# Patient Record
Sex: Male | Born: 1965 | Race: White | Hispanic: No | Marital: Married | Smoking: Current every day smoker
Health system: Southern US, Community
[De-identification: ages and names within clinical notes are randomized; demographics above are authoritative.]

## PROBLEM LIST (undated history)

## (undated) DIAGNOSIS — E7800 Pure hypercholesterolemia, unspecified: Secondary | ICD-10-CM

## (undated) HISTORY — PX: EXPLORATORY LAPAROTOMY: SUR591

---

## 2013-10-24 ENCOUNTER — Emergency Department (HOSPITAL_COMMUNITY)
Admission: EM | Admit: 2013-10-24 | Discharge: 2013-10-24 | Disposition: A | Discharge status: Home / Self Care | Payer: BC Managed Care – PPO | Attending: Emergency Medicine | Admitting: Emergency Medicine

## 2013-10-24 ENCOUNTER — Encounter (HOSPITAL_COMMUNITY): Payer: Self-pay | Admitting: Emergency Medicine

## 2013-10-24 ENCOUNTER — Emergency Department (HOSPITAL_COMMUNITY): Payer: BC Managed Care – PPO

## 2013-10-24 DIAGNOSIS — E7800 Pure hypercholesterolemia, unspecified: Secondary | ICD-10-CM | POA: Insufficient documentation

## 2013-10-24 DIAGNOSIS — B9789 Other viral agents as the cause of diseases classified elsewhere: Secondary | ICD-10-CM | POA: Insufficient documentation

## 2013-10-24 DIAGNOSIS — R000 Tachycardia, unspecified: Secondary | ICD-10-CM | POA: Insufficient documentation

## 2013-10-24 DIAGNOSIS — R5383 Other fatigue: Secondary | ICD-10-CM

## 2013-10-24 DIAGNOSIS — R5381 Other malaise: Secondary | ICD-10-CM | POA: Insufficient documentation

## 2013-10-24 DIAGNOSIS — R509 Fever, unspecified: Secondary | ICD-10-CM

## 2013-10-24 DIAGNOSIS — Z79899 Other long term (current) drug therapy: Secondary | ICD-10-CM | POA: Insufficient documentation

## 2013-10-24 DIAGNOSIS — IMO0001 Reserved for inherently not codable concepts without codable children: Secondary | ICD-10-CM | POA: Insufficient documentation

## 2013-10-24 DIAGNOSIS — B349 Viral infection, unspecified: Secondary | ICD-10-CM

## 2013-10-24 HISTORY — DX: Pure hypercholesterolemia, unspecified: E78.00

## 2013-10-24 LAB — BASIC METABOLIC PANEL
BUN: 7 mg/dL (ref 6–23)
CO2: 21 mEq/L (ref 19–32)
Calcium: 9.6 mg/dL (ref 8.4–10.5)
Chloride: 97 mEq/L (ref 96–112)
Creatinine, Ser: 0.74 mg/dL (ref 0.50–1.35)
GFR calc Af Amer: >90 mL/min (ref >90)
GFR calc non Af Amer: >90 mL/min (ref >90)
Glucose, Bld: 115 mg/dL — ABNORMAL HIGH (ref 70–99)
Potassium: 4.1 mEq/L (ref 3.7–5.3)
Sodium: 136 mEq/L — ABNORMAL LOW (ref 137–147)

## 2013-10-24 LAB — CBC
HCT: 42.5 % (ref 39.0–52.0)
Hemoglobin: 15.5 g/dL (ref 13.0–17.0)
MCH: 33.3 pg (ref 26.0–34.0)
MCHC: 36.5 g/dL — ABNORMAL HIGH (ref 30.0–36.0)
MCV: 91.2 fL (ref 78.0–100.0)
Platelets: 207 10*3/uL (ref 150–400)
RBC: 4.66 MIL/uL (ref 4.22–5.81)
RDW: 12.1 % (ref 11.5–15.5)
WBC: 7.5 10*3/uL (ref 4.0–10.5)

## 2013-10-24 LAB — I-STAT TROPONIN, ED: Troponin i, poc: 0 ng/mL (ref 0.00–0.08)

## 2013-10-24 MED ORDER — MORPHINE SULFATE 4 MG/ML IJ SOLN
4.0000 mg | Freq: Once | INTRAMUSCULAR | Status: AC
  Administered 2013-10-24: 4 mg via INTRAVENOUS
  Filled 2013-10-24: qty 1

## 2013-10-24 MED ORDER — ASPIRIN 325 MG PO TABS
325.0000 mg | ORAL_TABLET | ORAL | Status: AC
  Administered 2013-10-24: 325 mg via ORAL
  Filled 2013-10-24: qty 1

## 2013-10-24 MED ORDER — SODIUM CHLORIDE 0.9 % IV BOLUS (SEPSIS)
1000.0000 mL | Freq: Once | INTRAVENOUS | Status: AC
  Administered 2013-10-24: 1000 mL via INTRAVENOUS

## 2013-10-24 MED ORDER — ACETAMINOPHEN 325 MG PO TABS
650.0000 mg | ORAL_TABLET | Freq: Once | ORAL | Status: AC
  Administered 2013-10-24: 650 mg via ORAL
  Filled 2013-10-24: qty 2

## 2013-10-24 NOTE — Discharge Instructions (Signed)

## 2013-10-24 NOTE — ED Provider Notes (Signed)
CSN: [id number]     Arrival date & time 10/24/13  [id number] History   First MD Initiated Contact with Patient 10/24/13 [phone]     Chief Complaint  Patient presents with  . Chest Pain  . flu-like s/s      (Consider location/radiation/quality/duration/timing/severity/associated sxs/prior Treatment) Patient is a 48 y.o. male presenting with chest pain. The history is provided by the patient.  Chest Pain Pain location:  Substernal area Associated symptoms: fatigue   Associated symptoms: no abdominal pain, no back pain, no dysphagia, no headache, no nausea, no numbness, no shortness of breath, not vomiting and no weakness    patient states that he feels miserable all over. He has fevers and chills. He states he has some chest pain that goes from his mid chest over to his right arm. No cough. Mild sore throat. No nasal drainage. No cough. No shortness of breath, however he states it hurts to take a breath. No nausea vomiting or diarrhea. No dysuria. No sick contacts. No cardiac history.  Past Medical History  Diagnosis Date  . Hypercholesteremia    History reviewed. No pertinent past surgical history. No family history on file. History  Substance Use Topics  . Smoking status: Never Smoker   . Smokeless tobacco: Not on file  . Alcohol Use: 12.6 oz/week    21 Shots of liquor per week    Review of Systems  Constitutional: Positive for appetite change and fatigue. Negative for activity change.  HENT: Negative for rhinorrhea and trouble swallowing.   Eyes: Negative for pain.  Respiratory: Negative for chest tightness and shortness of breath.   Cardiovascular: Positive for chest pain. Negative for leg swelling.  Gastrointestinal: Negative for nausea, vomiting, abdominal pain and diarrhea.  Genitourinary: Negative for flank pain.  Musculoskeletal: Positive for myalgias. Negative for back pain, joint swelling and neck stiffness.  Skin: Negative for rash.  Neurological: Negative for weakness,  numbness and headaches.  Psychiatric/Behavioral: Negative for behavioral problems.      Allergies  Review of patient's allergies indicates no known allergies.  Home Medications   Current Outpatient Rx  Name  Route  Sig  Dispense  Refill  . atorvastatin (LIPITOR) 40 MG tablet   Oral   Take 40 mg by mouth daily.         . Pseudoephedrine-Ibuprofen (ADVIL COLD/SINUS PO)   Oral   Take 1 capsule by mouth 2 (two) times daily as needed.         . zolpidem (AMBIEN) 10 MG tablet   Oral   Take 10 mg by mouth at bedtime.          BP 114/67  Pulse 96  Temp(Src) 99.4 F (37.4 C) (Oral)  Resp 18  SpO2 98% Physical Exam  Nursing note and vitals reviewed. Constitutional: He is oriented to person, place, and time. He appears well-developed and well-nourished.  HENT:  Head: Normocephalic and atraumatic.  Eyes: Conjunctivae are normal.  Neck: Normal range of motion. Neck supple.  No meningismus  Cardiovascular: Regular rhythm and normal heart sounds.   No murmur heard. Tachycardia  Pulmonary/Chest: Effort normal and breath sounds normal.  Mild tachypnea  Abdominal: Soft. Bowel sounds are normal. He exhibits no distension and no mass. There is no tenderness. There is no rebound and no guarding.  Musculoskeletal: Normal range of motion. He exhibits no edema.  Neurological: He is alert and oriented to person, place, and time. No cranial nerve deficit.  Skin: Skin is warm and dry.  Psychiatric: He has a normal mood and affect.    ED Course  Procedures (including critical care time) Labs Review Labs Reviewed  CBC - Abnormal; Notable for the following:    MCHC 36.5 (*)    All other components within normal limits  BASIC METABOLIC PANEL - Abnormal; Notable for the following:    Sodium 136 (*)    Glucose, Bld 115 (*)    All other components within normal limits  I-STAT TROPOININ, ED   Imaging Review Dg Chest 2 View  10/24/2013   CLINICAL DATA:  Fever  EXAM: CHEST  2 VIEW   COMPARISON:  None.  FINDINGS: Low lung volumes. The heart size and mediastinal contours are within normal limits. Both lungs are clear. The visualized skeletal structures are unremarkable.  IMPRESSION: No active cardiopulmonary disease.   Electronically Signed   By: Salome Holmes M.D.   On: 10/24/2013 09:25     EKG Interpretation   Date/Time:  Thursday October 24 2013 08:27:04 EDT Ventricular Rate:  133 PR Interval:  134 QRS Duration: 86 QT Interval:  276 QTC Calculation: 410 R Axis:   79 Text Interpretation:  Sinus tachycardia Otherwise normal ECG Confirmed by  Rubin Payor  MD, Harrold Donath [phone]) on 10/24/2013 8:47:36 AM      MDM   Final diagnoses:  Fever  Viral infection    Patient with fever and chest pain. EKG and x-ray reassuring. Tachycardia has improved S. temperature is coming down. Maybe viral, or influenza. Patient feels much better after IV fluids and heart rate is normalized. Will discharge home. Doubt cardiac cause of the pain.    Juliet Rude. Rubin Payor, MD 10/24/13 1244

## 2013-10-24 NOTE — ED Notes (Signed)
PT states he was awoken with sub-sternal chest pain at 3 am.  Also c/o chills, cough and headache. Temp of 101.3 in triage.

## 2013-10-24 NOTE — ED Notes (Signed)
Pt comfortable with discharge and follow up instructions. No prescriptions given. 

## 2015-05-14 ENCOUNTER — Other Ambulatory Visit (HOSPITAL_COMMUNITY): Payer: Self-pay | Admitting: Orthopedic Surgery

## 2015-05-14 ENCOUNTER — Ambulatory Visit (HOSPITAL_COMMUNITY)
Admission: RE | Admit: 2015-05-14 | Discharge: 2015-05-14 | Disposition: A | Discharge status: Home / Self Care | Payer: BLUE CROSS/BLUE SHIELD | Source: Ambulatory Visit | Attending: Internal Medicine | Admitting: Internal Medicine

## 2015-05-14 DIAGNOSIS — M79605 Pain in left leg: Secondary | ICD-10-CM | POA: Insufficient documentation

## 2015-05-14 DIAGNOSIS — E785 Hyperlipidemia, unspecified: Secondary | ICD-10-CM | POA: Insufficient documentation

## 2016-03-14 DIAGNOSIS — Z1211 Encounter for screening for malignant neoplasm of colon: Secondary | ICD-10-CM | POA: Diagnosis not present

## 2016-03-14 DIAGNOSIS — K5730 Diverticulosis of large intestine without perforation or abscess without bleeding: Secondary | ICD-10-CM | POA: Diagnosis not present

## 2016-03-14 DIAGNOSIS — K640 First degree hemorrhoids: Secondary | ICD-10-CM | POA: Diagnosis not present

## 2016-10-12 DIAGNOSIS — R7309 Other abnormal glucose: Secondary | ICD-10-CM | POA: Diagnosis not present

## 2016-10-12 DIAGNOSIS — E7800 Pure hypercholesterolemia, unspecified: Secondary | ICD-10-CM | POA: Diagnosis not present

## 2016-10-12 DIAGNOSIS — Z0000 Encounter for general adult medical examination without abnormal findings: Secondary | ICD-10-CM | POA: Diagnosis not present

## 2016-10-17 DIAGNOSIS — Z0000 Encounter for general adult medical examination without abnormal findings: Secondary | ICD-10-CM | POA: Diagnosis not present

## 2016-10-17 DIAGNOSIS — R51 Headache: Secondary | ICD-10-CM | POA: Diagnosis not present

## 2016-10-17 DIAGNOSIS — G4700 Insomnia, unspecified: Secondary | ICD-10-CM | POA: Diagnosis not present

## 2016-10-17 DIAGNOSIS — E7800 Pure hypercholesterolemia, unspecified: Secondary | ICD-10-CM | POA: Diagnosis not present

## 2016-10-17 DIAGNOSIS — R7309 Other abnormal glucose: Secondary | ICD-10-CM | POA: Diagnosis not present

## 2016-10-19 ENCOUNTER — Other Ambulatory Visit: Payer: Self-pay | Admitting: Family Medicine

## 2016-10-19 DIAGNOSIS — R519 Headache, unspecified: Secondary | ICD-10-CM

## 2016-10-19 DIAGNOSIS — R51 Headache: Principal | ICD-10-CM

## 2016-10-24 ENCOUNTER — Ambulatory Visit
Admission: RE | Admit: 2016-10-24 | Discharge: 2016-10-24 | Disposition: A | Discharge status: Home / Self Care | Payer: BLUE CROSS/BLUE SHIELD | Source: Ambulatory Visit | Attending: Family Medicine | Admitting: Family Medicine

## 2016-10-24 DIAGNOSIS — R51 Headache: Secondary | ICD-10-CM | POA: Diagnosis not present

## 2016-10-24 DIAGNOSIS — R519 Headache, unspecified: Secondary | ICD-10-CM

## 2016-12-22 ENCOUNTER — Emergency Department (HOSPITAL_COMMUNITY): Payer: Worker's Compensation

## 2016-12-22 ENCOUNTER — Emergency Department (HOSPITAL_COMMUNITY)
Admission: EM | Admit: 2016-12-22 | Discharge: 2016-12-22 | Disposition: A | Discharge status: Home / Self Care | Payer: Worker's Compensation | Attending: Emergency Medicine | Admitting: Emergency Medicine

## 2016-12-22 ENCOUNTER — Encounter (HOSPITAL_COMMUNITY): Payer: Self-pay | Admitting: Emergency Medicine

## 2016-12-22 DIAGNOSIS — S6992XA Unspecified injury of left wrist, hand and finger(s), initial encounter: Secondary | ICD-10-CM

## 2016-12-22 DIAGNOSIS — Y939 Activity, unspecified: Secondary | ICD-10-CM | POA: Diagnosis not present

## 2016-12-22 DIAGNOSIS — Y929 Unspecified place or not applicable: Secondary | ICD-10-CM | POA: Insufficient documentation

## 2016-12-22 DIAGNOSIS — F17200 Nicotine dependence, unspecified, uncomplicated: Secondary | ICD-10-CM | POA: Insufficient documentation

## 2016-12-22 DIAGNOSIS — Z79899 Other long term (current) drug therapy: Secondary | ICD-10-CM | POA: Insufficient documentation

## 2016-12-22 DIAGNOSIS — S61012A Laceration without foreign body of left thumb without damage to nail, initial encounter: Secondary | ICD-10-CM | POA: Diagnosis present

## 2016-12-22 DIAGNOSIS — W312XXA Contact with powered woodworking and forming machines, initial encounter: Secondary | ICD-10-CM | POA: Diagnosis not present

## 2016-12-22 DIAGNOSIS — Y999 Unspecified external cause status: Secondary | ICD-10-CM | POA: Diagnosis not present

## 2016-12-22 MED ORDER — LIDOCAINE HCL (PF) 1 % IJ SOLN
30.0000 mL | Freq: Once | INTRAMUSCULAR | Status: AC
  Administered 2016-12-22: 30 mL
  Filled 2016-12-22: qty 30

## 2016-12-22 MED ORDER — BUPIVACAINE HCL 0.25 % IJ SOLN
20.0000 mL | Freq: Once | INTRAMUSCULAR | Status: AC
  Administered 2016-12-22: 20 mL
  Filled 2016-12-22: qty 20

## 2016-12-22 MED ORDER — SULFAMETHOXAZOLE-TRIMETHOPRIM 800-160 MG PO TABS
1.0000 | ORAL_TABLET | Freq: Two times a day (BID) | ORAL | 0 refills | Status: AC
Start: 2016-12-22 — End: 2016-12-29

## 2016-12-22 MED ORDER — HYDROCODONE-ACETAMINOPHEN 5-325 MG PO TABS: 1.0000 | ORAL_TABLET | Freq: Four times a day (QID) | ORAL | 0 refills | Status: DC | PRN

## 2016-12-22 NOTE — ED Triage Notes (Signed)
Pt states he cut his left thumb on a table saw   Pt went to urgent care and was sent here for further treatment  Bleeding controlled  Pt is able to move and bend his thumb

## 2016-12-22 NOTE — ED Provider Notes (Signed)
[facility]-EMERGENCY DEPT Provider Note   CSN: [id number] Arrival date & time: 12/22/16  1956 By signing my name below, I, Logan Martinez, attest that this documentation has been prepared under the direction and in the presence of non-physician practitioner, Shanna Cisco, PA-C. Electronically Signed: Levon Martinez, Scribe. 12/22/2016. 8:56 PM.   History   Chief Complaint Chief Complaint  Patient presents with  . Finger Injury   HPI Logan Martinez is a 51 y.o. male who presents to the Emergency Department complaining of a laceration to his left thumb sustained today at about 12:30 pm. Per pt, he accidentally cut his thumb while using a table saw. He reports associated moderate, throbbing pain to his left thumb. Pt initially went to Urgent Care, but was sent to the ED for further evaluation due to the severity of the laceration. He wrapped his thumb in gauze PTA, but did not attempt to soak or clean the area. Pt denies any numbness or weakness. Pt has no other acute complaints or associated symptoms at this time.  Tetanus UTD.  The history is provided by the patient. No language interpreter was used.   Past Medical History:  Diagnosis Date  . Hypercholesteremia     There are no active problems to display for this patient.   Past Surgical History:  Procedure Laterality Date  . EXPLORATORY LAPAROTOMY         Home Medications    Prior to Admission medications   Medication Sig Start Date End Date Taking? Authorizing Provider  atorvastatin (LIPITOR) 40 MG tablet Take 40 mg by mouth daily.    [provider]  HYDROcodone-acetaminophen (NORCO/VICODIN) 5-325 MG tablet Take 1 tablet by mouth every 6 (six) hours as needed for severe pain. 12/22/16   Syble Picco, Chase Picket, PA-C  Pseudoephedrine-Ibuprofen (ADVIL COLD/SINUS PO) Take 1 capsule by mouth 2 (two) times daily as needed.    [provider]  sulfamethoxazole-trimethoprim (BACTRIM DS,SEPTRA DS) 800-160 MG tablet Take 1  tablet by mouth 2 (two) times daily. 12/22/16 12/29/16  Warda Mcqueary, Chase Picket, PA-C  zolpidem (AMBIEN) 10 MG tablet Take 10 mg by mouth at bedtime.    [provider]    Family History Family History  Problem Relation Age of Onset  . Diabetes Other     Social History Social History  Substance Use Topics  . Smoking status: Current Every Day Smoker  . Smokeless tobacco: Never Used  . Alcohol use Yes     Comment: social     Allergies   Patient has no known allergies.   Review of Systems Review of Systems  Musculoskeletal: Positive for myalgias.  Skin: Positive for wound.  Neurological: Negative for weakness and numbness.   Physical Exam Updated Vital Signs BP (!) 159/97 (BP Location: Right Arm)   Pulse 84   Temp 99 F (37.2 C) (Oral)   Resp 18   Wt 195 lb 6.4 oz (88.6 kg)   SpO2 99%   Physical Exam  Constitutional: He appears well-developed and well-nourished. No distress.  HENT:  Head: Normocephalic and atraumatic.  Neck: Neck supple.  Cardiovascular: Normal rate, regular rhythm and normal heart sounds.   No murmur heard. Pulmonary/Chest: Effort normal and breath sounds normal. No respiratory distress. He has no wheezes. He has no rales.  Musculoskeletal:  LUE with full ROM. 2+ radial pulse. Sensation intact.  Neurological: He is alert.  Skin: Skin is warm and dry.  See image below  Nursing note and vitals reviewed.  ED Treatments / Results  DIAGNOSTIC STUDIES:  Oxygen Saturation is 99% on RA, normal by my interpretation.    COORDINATION OF CARE:  8:56 PM Discussed treatment plan with pt at bedside and pt agreed to plan.   Labs (all labs ordered are listed, but only abnormal results are displayed) Labs Reviewed - No data to display  EKG  EKG Interpretation None       Radiology Dg Finger Thumb Left  Result Date: 12/22/2016 CLINICAL DATA:  Table saw injury EXAM: LEFT THUMB 2+V COMPARISON:  None. FINDINGS: Markedly comminuted  fracture involving the tuft of the first distal phalanx with overlying soft tissue injury. Distraction and displacement of the fracture fragments. No subluxation. IMPRESSION: Markedly comminuted and distracted fracture involving the tuft of the first distal phalanx with associated soft tissue injury. Electronically Signed   By: Jasmine PangKim  Fujinaga M.D.   On: 12/22/2016 21:09    Procedures Procedures (including critical care time)  Medications Ordered in ED Medications  bupivacaine (MARCAINE) 0.25 % (with pres) injection 20 mL (not administered)  lidocaine (PF) (XYLOCAINE) 1 % injection 30 mL (not administered)     Initial Impression / Assessment and Plan / ED Course  I have reviewed the triage vital signs and the nursing notes.  Pertinent labs & imaging results that were available during my care of the patient were reviewed by me and considered in my medical decision making (see chart for details).    Philis Nettleaul N Kingsley is a 51 y.o. male who presents to ED for extensive laceration to left thumb 2/2 table saw around 12:30 today. LUE is NVI. X-ray shows comminuted and distracted fracture to tuft of phalanx. Hand surgery, Dr. Merlyn LotKuzma, consulted who will come evaluate patient and repair.   Hand at bedside, appreciate his assistance with patient care today. Please see consultation note for further details of repair. Per Dr. Merlyn LotKuzma, patient to follow up with him in the office next week. Dc home with ABX and short course pain meds. Patient understands and agrees with plan. All questions answered.   Patient discussed with Dr. Juleen ChinaKohut who agrees with treatment plan.    Final Clinical Impressions(s) / ED Diagnoses   Final diagnoses:  Injury of finger of left hand, initial encounter    New Prescriptions New Prescriptions   HYDROCODONE-ACETAMINOPHEN (NORCO/VICODIN) 5-325 MG TABLET    Take 1 tablet by mouth every 6 (six) hours as needed for severe pain.   SULFAMETHOXAZOLE-TRIMETHOPRIM (BACTRIM DS,SEPTRA DS)  800-160 MG TABLET    Take 1 tablet by mouth 2 (two) times daily.   I personally performed the services described in this documentation, which was scribed in my presence. The recorded information has been reviewed and is accurate.    Erilyn Pearman, Chase PicketJaime Pilcher, PA-C 12/22/16 2310    Raeford RazorKohut, Stephen, MD 12/31/16 [phone]

## 2016-12-22 NOTE — ED Notes (Signed)
Kuzma Hydrographic surveyorhand surgeon at bedside.

## 2016-12-22 NOTE — Discharge Instructions (Signed)
It was my pleasure taking care of you today!   Please take all of your antibiotics until finished!  Tylenol or ibuprofen for mild to moderate pain. I have written you a prescription for pain medication only to be taken for severe pain. See information below about pain medication.   Please call the hand surgeon tomorrow morning to schedule a follow up appointment. He would like to see you in about a week.   Return to ER for new or worsening symptoms, any additional concerns.  Do not drink alcohol, drive or participate in any other potentially dangerous activities while taking opiate pain medication as it may make you sleepy. Do not take this medication with any other sedating medications, either prescription or over-the-counter. If you were prescribed Percocet or Vicodin, do not take these with acetaminophen (Tylenol) as it is already contained within these medications.   This medication is an opiate (or narcotic) pain medication and can be habit forming.  Use it as little as possible to achieve adequate pain control.  Do not use or use it with extreme caution if you have a history of opiate abuse or dependence. This medication is intended for your use only - do not give any to anyone else and keep it in a secure place where nobody else, especially children, have access to it. It will also cause or worsen constipation, so you may want to consider taking an over-the-counter stool softener while you are taking this medication. You should not drive or operate heavy machinery while on this medication.

## 2016-12-22 NOTE — ED Notes (Signed)
Pt reports cutting his L thumb with a table saw tonight.  Tip of his thumb is missing.

## 2016-12-23 NOTE — Consult Note (Signed)
Logan Martinez is an 51 y.o. male.   Chief Complaint: left thumb saw injury HPI: 51 yo rhd male present with wife states he injured left thumb on table saw earlier today.  [facility]  Reports no previous injury to thumb and no other injury at this time.  Describes minimal pain.  Alleviated with rest and aggravated with palpation.  Associated bleeding from injury.  Case discussed with Elizabeth SauerJaime Ward, [facility] and her note from 12/23/2016 reviewed. Xrays viewed and interpreted by me: ap, lateral, oblique left thumb show distal phalanx tuft fracture with displaced fragments Labs reviewed: none  Allergies: No Known Allergies  Past Medical History:  Diagnosis Date  . Hypercholesteremia     Past Surgical History:  Procedure Laterality Date  . EXPLORATORY LAPAROTOMY      Family History: Family History  Problem Relation Age of Onset  . Diabetes Other     Social History:   reports that he has been smoking.  He has never used smokeless tobacco. He reports that he drinks alcohol. He reports that he does not use drugs.  Medications:  (Not in a hospital admission)  No results found for this or any previous visit (from the past 48 hour(s)).  Dg Finger Thumb Left  Result Date: 12/22/2016 CLINICAL DATA:  Table saw injury EXAM: LEFT THUMB 2+V COMPARISON:  None. FINDINGS: Markedly comminuted fracture involving the tuft of the first distal phalanx with overlying soft tissue injury. Distraction and displacement of the fracture fragments. No subluxation. IMPRESSION: Markedly comminuted and distracted fracture involving the tuft of the first distal phalanx with associated soft tissue injury. Electronically Signed   By: Jasmine PangKim  Fujinaga M.D.   On: 12/22/2016 21:09     A comprehensive review of systems was negative. Review of Systems: No fevers, chills, night sweats, chest pain, shortness of breath, nausea, vomiting, diarrhea, constipation, easy bleeding or bruising, headaches, dizziness,  vision changes, fainting.   Blood pressure (!) 159/97, pulse 84, temperature 99 F (37.2 C), temperature source Oral, resp. rate 18, weight 88.6 kg (195 lb 6.4 oz), SpO2 99 %.  General appearance: alert, cooperative and appears stated age Head: Normocephalic, without obvious abnormality, atraumatic Neck: supple Extremities: Intact sensation and capillary refill all digits.  +epl/fpl/io.  Left thumb with volar pad laceration and nail injury.  Able to flex against resistance without pain. Pulses: 2+ and symmetric Skin: Skin color, texture, turgor normal. No rashes or lesions Neurologic: Grossly normal Incision/Wound: Volar left thumb wound  Assessment/Plan Left thumb saw injury with open distal phalanx fracture and nail injury.  Recommend irrigation and debridement and repair under digital block.  Risks, benefits, and alternatives of procedure were discussed and the patient agrees with the plan of care.  PROCEDURE NOTE:  Digital block performed to left thumb with 10mL half and half 1% plain lidocaine and 0.25% plain marcaine.  Adequate to give digital anesthesia.  Wound and open fracture copiously irrigated with 1000mL sterile saline and betadine solution.  Finger prepped with betadine and sterilely draped.  Penrose used as a tourniquet and was up ~ 20  minutes.  Remainder of nail removed with freer elevator.  Hematoma removed.  Devitalized skin sharply excised with scissors. Fracture fragments reduced and nail bed repaired with 5-0 chromic suture.  Skin repaired with 5-0 monocryl suture.  Good approximation achieved.  Xeroform placed in nail fold and wounds dressed with sterile xeroform, 4x4s, and wrapped lightly with coban dressing.  Alumafoam splint placed and wrapped  lightly with coban dressing.  Penrose drain removed.  Patient tolerated procedure well.  Follow up in 1 week.  Pain meds and antibiotic prescribed by ED.   Kael Keetch R 12/23/2016, 12:06 AM

## 2017-10-23 DIAGNOSIS — R5383 Other fatigue: Secondary | ICD-10-CM | POA: Diagnosis not present

## 2017-10-23 DIAGNOSIS — R7309 Other abnormal glucose: Secondary | ICD-10-CM | POA: Diagnosis not present

## 2017-10-23 DIAGNOSIS — G4700 Insomnia, unspecified: Secondary | ICD-10-CM | POA: Diagnosis not present

## 2017-10-23 DIAGNOSIS — E7800 Pure hypercholesterolemia, unspecified: Secondary | ICD-10-CM | POA: Diagnosis not present

## 2017-10-23 DIAGNOSIS — R3915 Urgency of urination: Secondary | ICD-10-CM | POA: Diagnosis not present

## 2017-10-23 DIAGNOSIS — Z0000 Encounter for general adult medical examination without abnormal findings: Secondary | ICD-10-CM | POA: Diagnosis not present

## 2017-10-23 DIAGNOSIS — Z125 Encounter for screening for malignant neoplasm of prostate: Secondary | ICD-10-CM | POA: Diagnosis not present

## 2017-11-10 DIAGNOSIS — R7989 Other specified abnormal findings of blood chemistry: Secondary | ICD-10-CM | POA: Diagnosis not present

## 2017-11-10 DIAGNOSIS — R945 Abnormal results of liver function studies: Secondary | ICD-10-CM | POA: Diagnosis not present

## 2018-11-06 DIAGNOSIS — Z8249 Family history of ischemic heart disease and other diseases of the circulatory system: Secondary | ICD-10-CM | POA: Diagnosis not present

## 2018-11-06 DIAGNOSIS — Z0000 Encounter for general adult medical examination without abnormal findings: Secondary | ICD-10-CM | POA: Diagnosis not present

## 2018-11-06 DIAGNOSIS — E7800 Pure hypercholesterolemia, unspecified: Secondary | ICD-10-CM | POA: Diagnosis not present

## 2018-11-06 DIAGNOSIS — Z833 Family history of diabetes mellitus: Secondary | ICD-10-CM | POA: Diagnosis not present

## 2018-11-06 DIAGNOSIS — R7309 Other abnormal glucose: Secondary | ICD-10-CM | POA: Diagnosis not present

## 2018-11-06 DIAGNOSIS — N400 Enlarged prostate without lower urinary tract symptoms: Secondary | ICD-10-CM | POA: Diagnosis not present

## 2018-11-08 DIAGNOSIS — E875 Hyperkalemia: Secondary | ICD-10-CM | POA: Diagnosis not present

## 2019-02-07 DIAGNOSIS — Z1159 Encounter for screening for other viral diseases: Secondary | ICD-10-CM | POA: Diagnosis not present

## 2019-03-20 IMAGING — CR DG FINGER THUMB 2+V*L*
3 series · 3 of 3 positions shown · non-contrast
Comparison: None.

CLINICAL DATA: Table saw injury

EXAM:
LEFT THUMB 2+V

[x finger pa left]
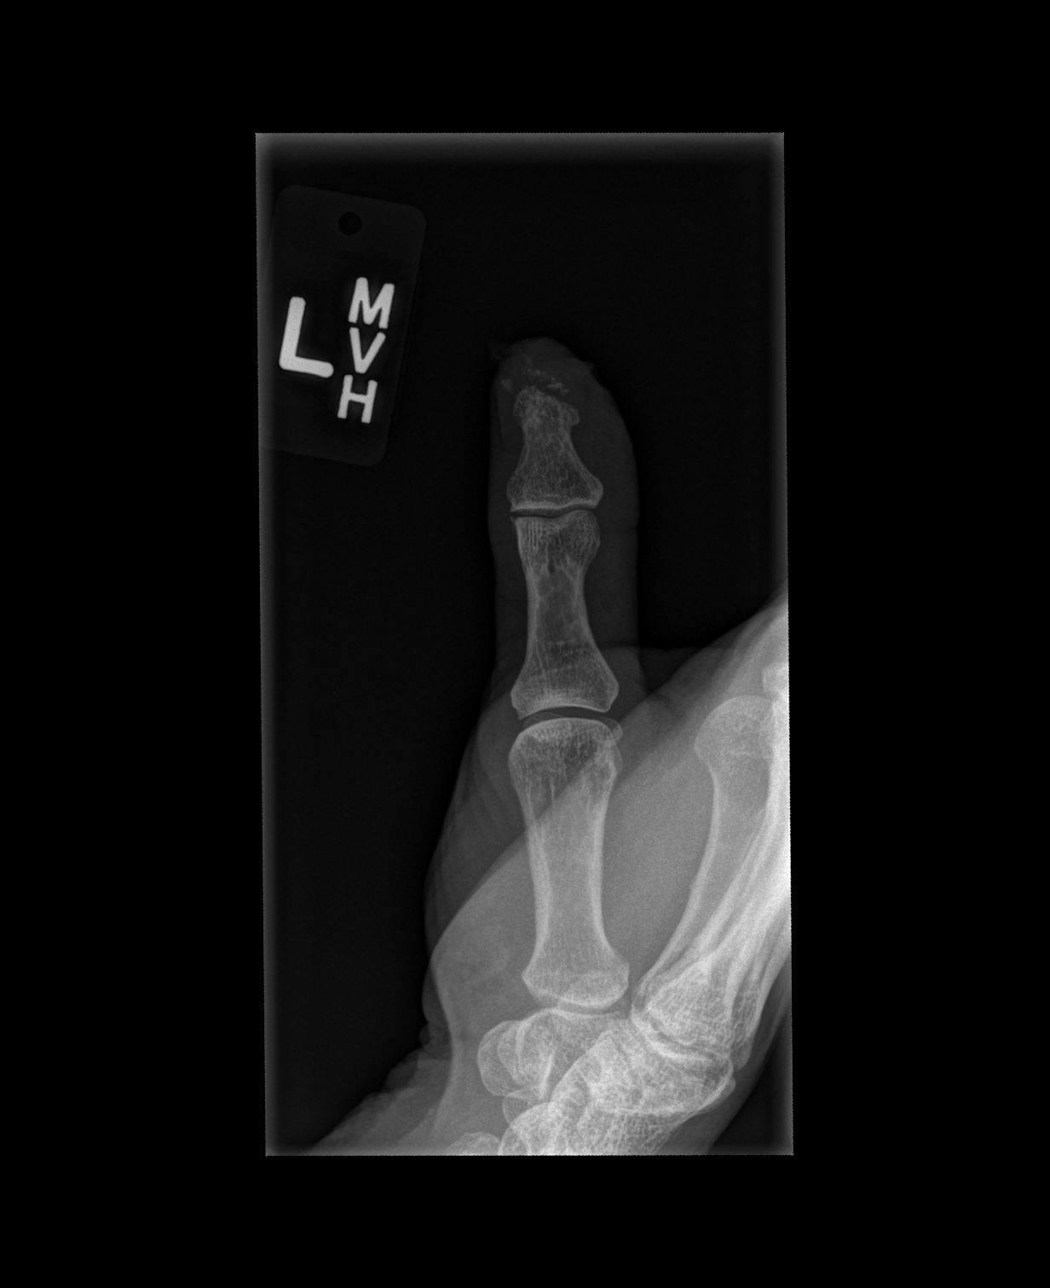

[x finger obl left]
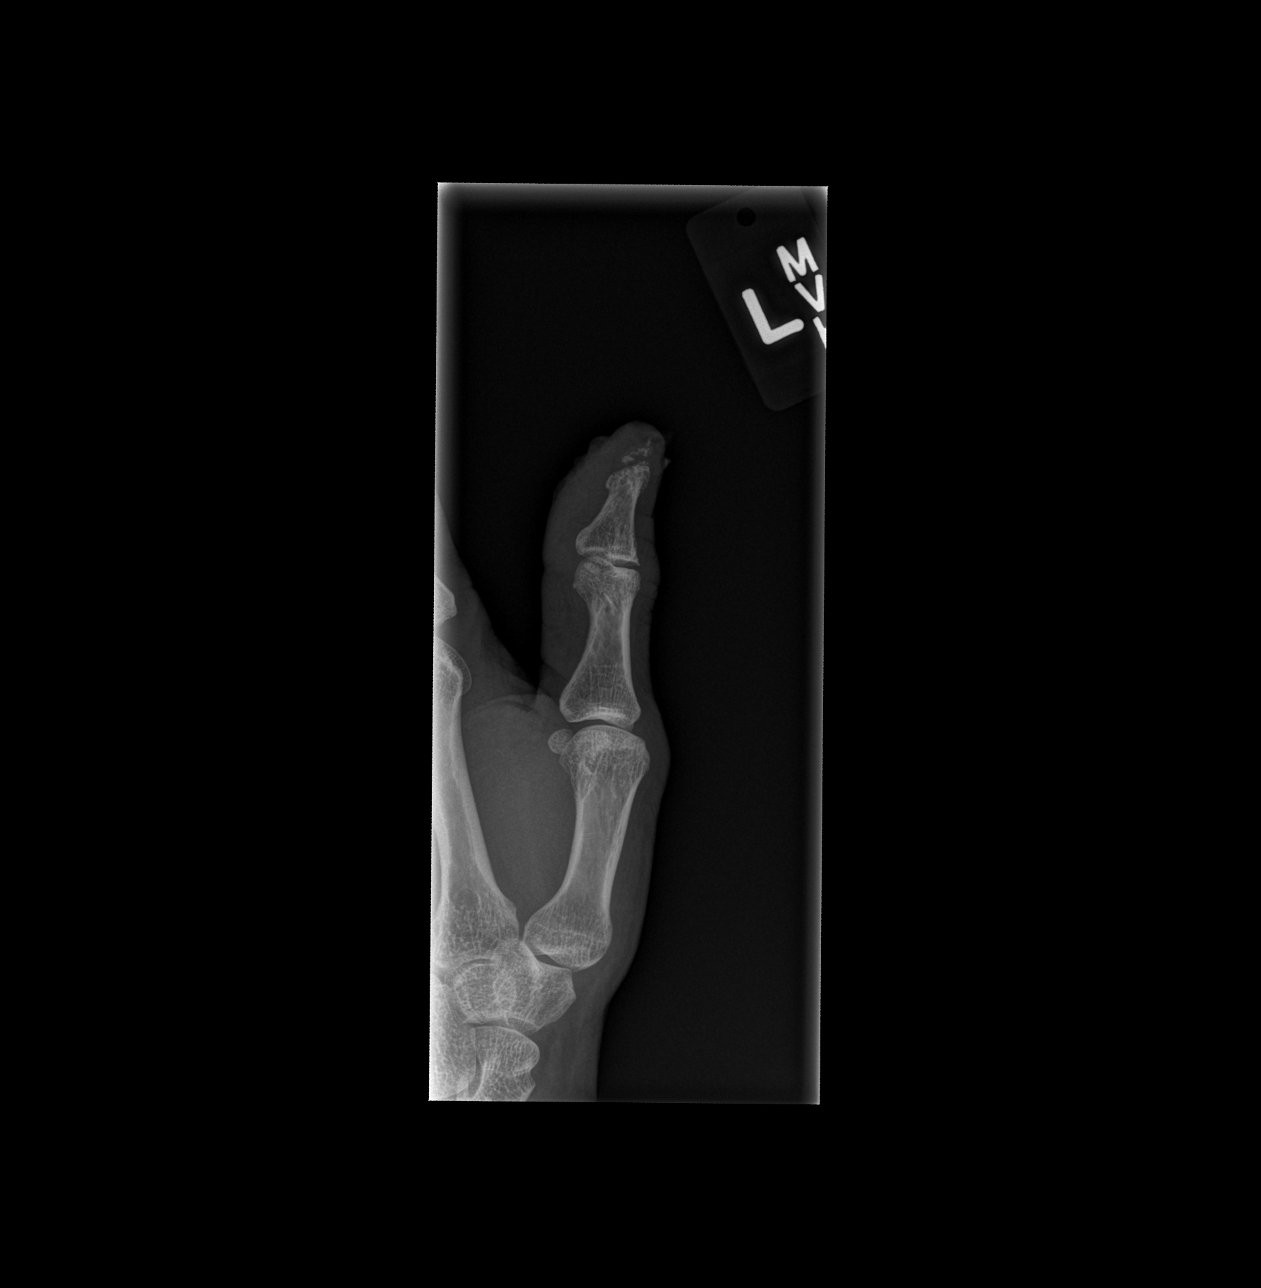

[x finger lat left]
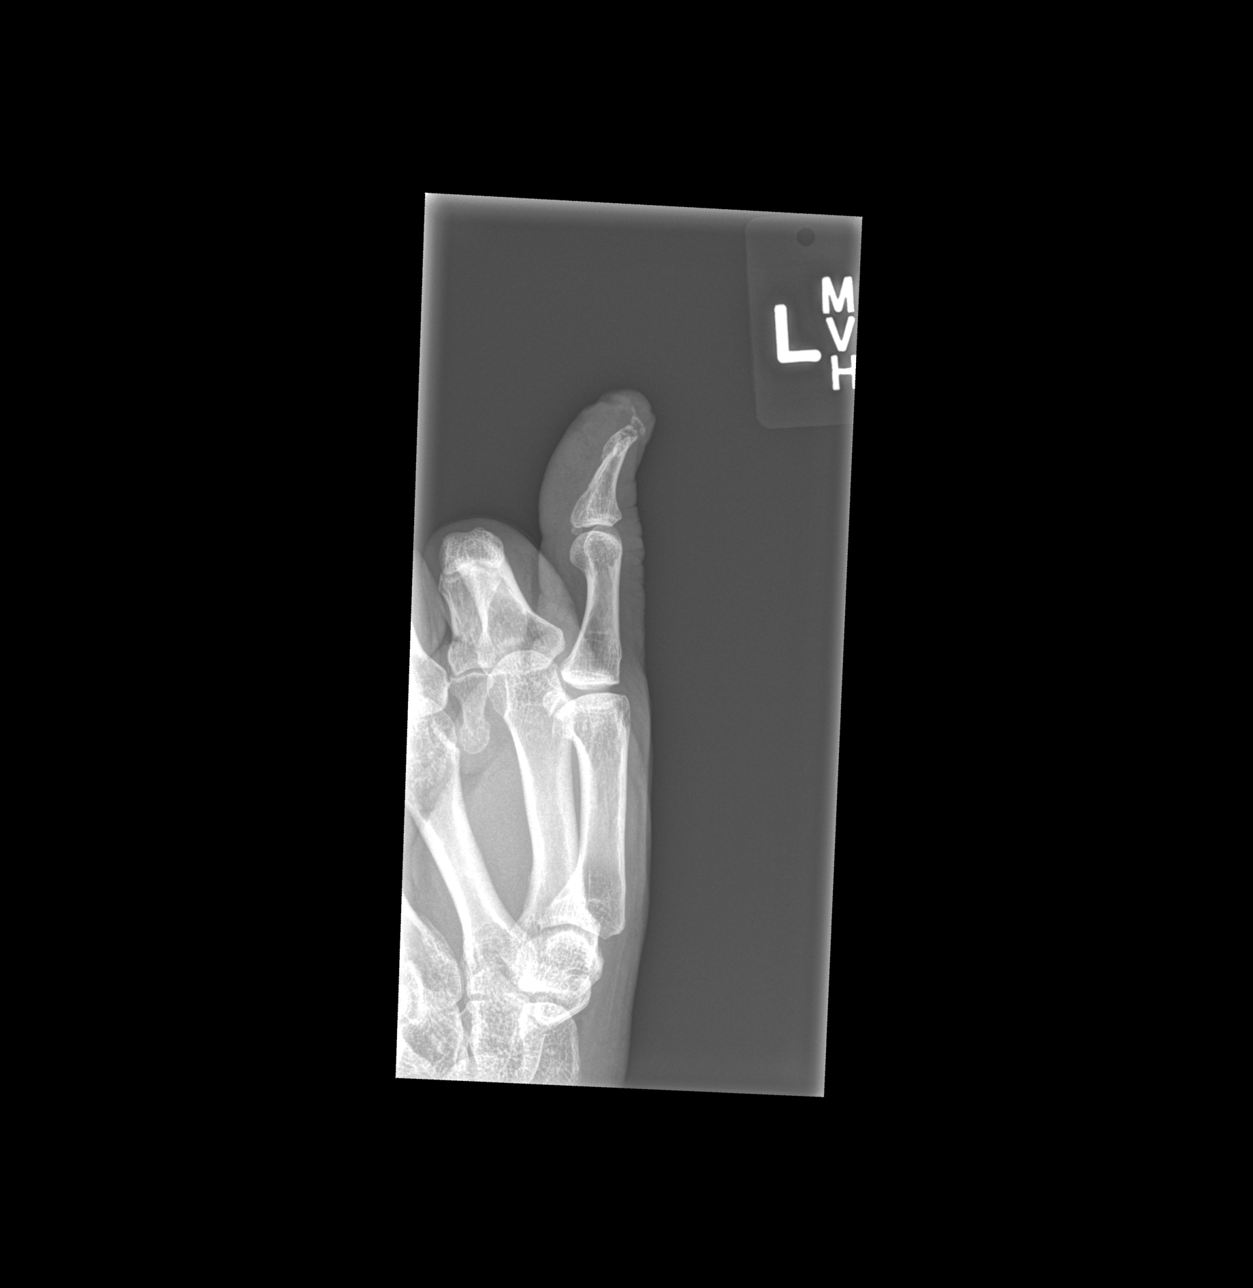

[3 of 3 positions shown; findings below may reference images not displayed]

FINDINGS: Markedly comminuted fracture involving the tuft of the first distal
phalanx with overlying soft tissue injury. Distraction and
displacement of the fracture fragments. No subluxation.
IMPRESSION: Markedly comminuted and distracted fracture involving the tuft of
the first distal phalanx with associated soft tissue injury.

## 2019-11-06 DIAGNOSIS — E7800 Pure hypercholesterolemia, unspecified: Secondary | ICD-10-CM | POA: Diagnosis not present

## 2019-11-06 DIAGNOSIS — R7309 Other abnormal glucose: Secondary | ICD-10-CM | POA: Diagnosis not present

## 2019-11-06 DIAGNOSIS — N400 Enlarged prostate without lower urinary tract symptoms: Secondary | ICD-10-CM | POA: Diagnosis not present

## 2019-11-12 DIAGNOSIS — Z0000 Encounter for general adult medical examination without abnormal findings: Secondary | ICD-10-CM | POA: Diagnosis not present

## 2019-11-12 DIAGNOSIS — R7303 Prediabetes: Secondary | ICD-10-CM | POA: Diagnosis not present

## 2019-11-12 DIAGNOSIS — E7800 Pure hypercholesterolemia, unspecified: Secondary | ICD-10-CM | POA: Diagnosis not present

## 2019-11-12 DIAGNOSIS — G4700 Insomnia, unspecified: Secondary | ICD-10-CM | POA: Diagnosis not present

## 2019-11-12 DIAGNOSIS — N400 Enlarged prostate without lower urinary tract symptoms: Secondary | ICD-10-CM | POA: Diagnosis not present

## 2019-11-14 ENCOUNTER — Other Ambulatory Visit: Payer: Self-pay | Admitting: Family Medicine

## 2019-11-14 DIAGNOSIS — R7989 Other specified abnormal findings of blood chemistry: Secondary | ICD-10-CM

## 2019-11-18 ENCOUNTER — Ambulatory Visit
Admission: RE | Admit: 2019-11-18 | Discharge: 2019-11-18 | Disposition: A | Discharge status: Home / Self Care | Payer: BLUE CROSS/BLUE SHIELD | Source: Ambulatory Visit | Attending: Family Medicine | Admitting: Family Medicine

## 2019-11-18 DIAGNOSIS — K760 Fatty (change of) liver, not elsewhere classified: Secondary | ICD-10-CM | POA: Diagnosis not present

## 2019-11-18 DIAGNOSIS — R7989 Other specified abnormal findings of blood chemistry: Secondary | ICD-10-CM

## 2020-02-12 DIAGNOSIS — R7303 Prediabetes: Secondary | ICD-10-CM | POA: Diagnosis not present

## 2020-02-12 DIAGNOSIS — R7989 Other specified abnormal findings of blood chemistry: Secondary | ICD-10-CM | POA: Diagnosis not present

## 2020-11-18 DIAGNOSIS — R030 Elevated blood-pressure reading, without diagnosis of hypertension: Secondary | ICD-10-CM | POA: Diagnosis not present

## 2020-11-18 DIAGNOSIS — G4700 Insomnia, unspecified: Secondary | ICD-10-CM | POA: Diagnosis not present

## 2020-11-18 DIAGNOSIS — Z125 Encounter for screening for malignant neoplasm of prostate: Secondary | ICD-10-CM | POA: Diagnosis not present

## 2020-11-18 DIAGNOSIS — Z0000 Encounter for general adult medical examination without abnormal findings: Secondary | ICD-10-CM | POA: Diagnosis not present

## 2020-11-18 DIAGNOSIS — Z131 Encounter for screening for diabetes mellitus: Secondary | ICD-10-CM | POA: Diagnosis not present

## 2020-11-18 DIAGNOSIS — K760 Fatty (change of) liver, not elsewhere classified: Secondary | ICD-10-CM | POA: Diagnosis not present

## 2020-11-19 ENCOUNTER — Other Ambulatory Visit: Payer: Self-pay | Admitting: Family Medicine

## 2020-11-19 DIAGNOSIS — R7989 Other specified abnormal findings of blood chemistry: Secondary | ICD-10-CM

## 2020-12-02 DIAGNOSIS — R945 Abnormal results of liver function studies: Secondary | ICD-10-CM | POA: Diagnosis not present

## 2020-12-10 ENCOUNTER — Ambulatory Visit
Admission: RE | Admit: 2020-12-10 | Discharge: 2020-12-10 | Disposition: A | Discharge status: Home / Self Care | Payer: BC Managed Care – PPO | Source: Ambulatory Visit | Attending: Family Medicine | Admitting: Family Medicine

## 2020-12-10 DIAGNOSIS — R7989 Other specified abnormal findings of blood chemistry: Secondary | ICD-10-CM

## 2020-12-10 DIAGNOSIS — K760 Fatty (change of) liver, not elsewhere classified: Secondary | ICD-10-CM | POA: Diagnosis not present

## 2021-01-22 DIAGNOSIS — E7800 Pure hypercholesterolemia, unspecified: Secondary | ICD-10-CM | POA: Diagnosis not present

## 2022-02-13 IMAGING — US US ABDOMEN COMPLETE
1 series · 14 of 25 positions shown · non-contrast
Comparison: None.

CLINICAL DATA: Elevated LFTs.

EXAM:
ABDOMEN ULTRASOUND COMPLETE

[Series 1: us abdomen complete · 0.26mm/px · 14 of 80 slices shown]
[im 1/80]
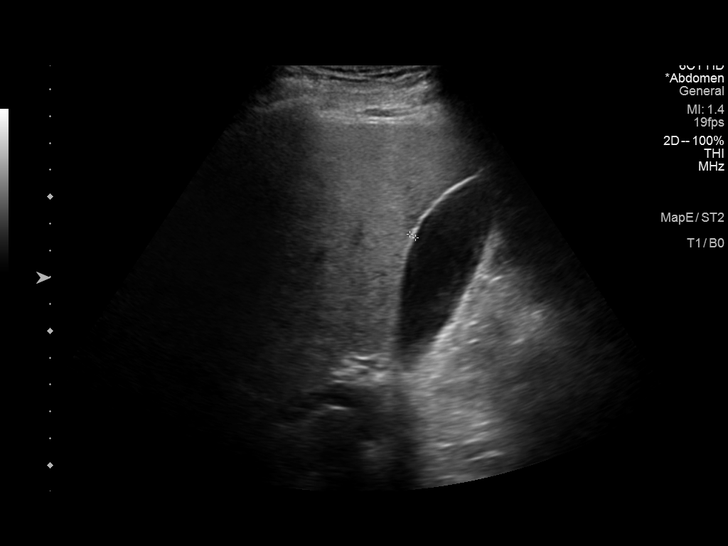
[im 7/80]
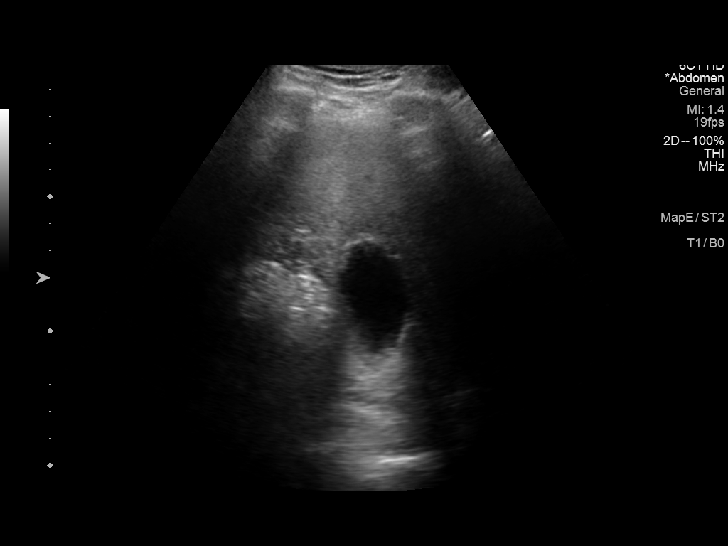
[im 14/80]
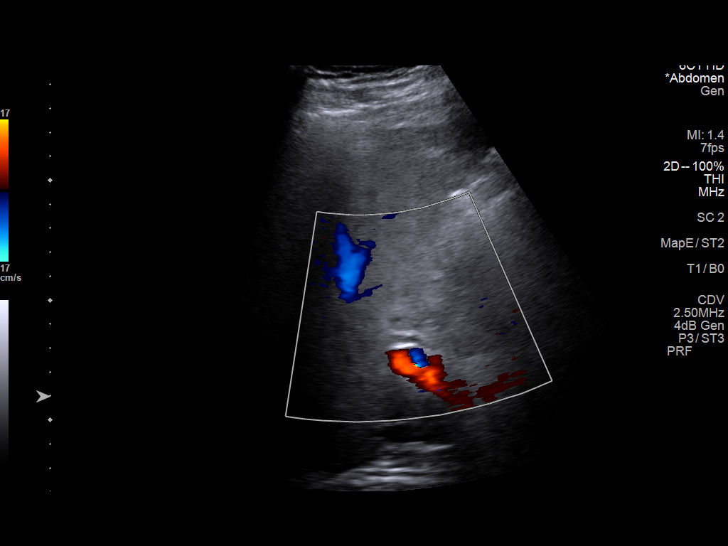
[im 20/80]
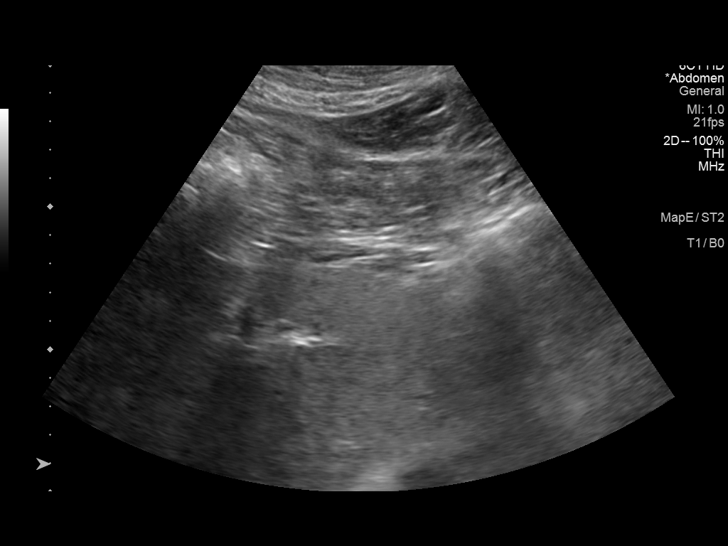
[im 27/80]
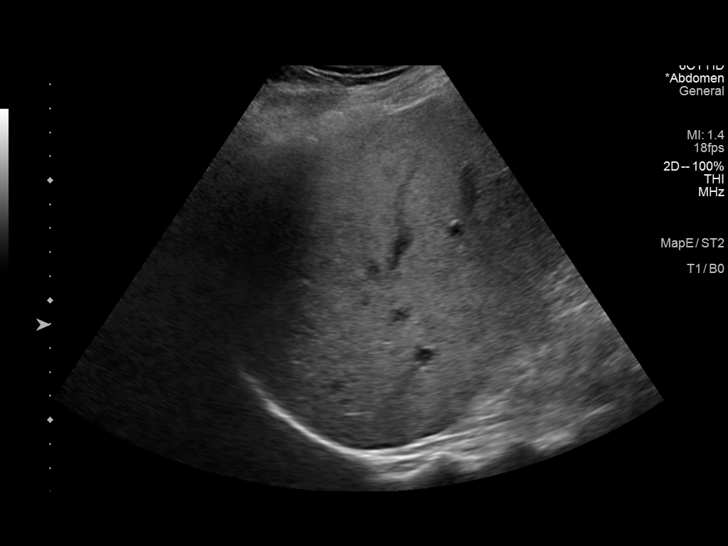
[im 30/80]
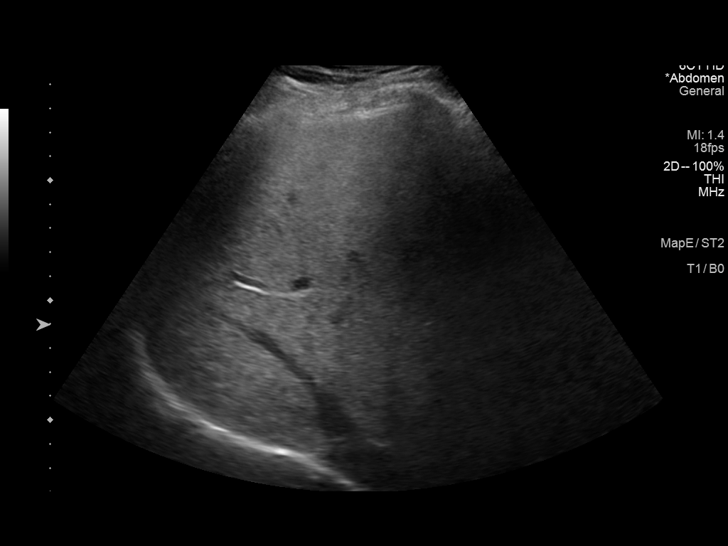
[im 37/80]
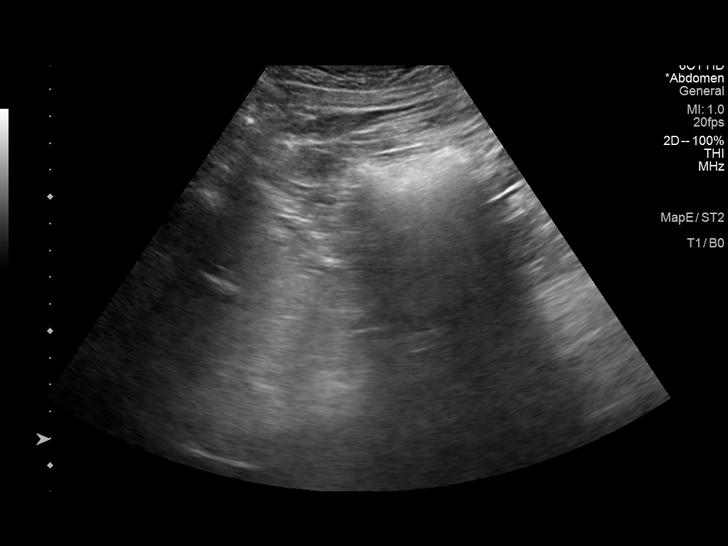
[im 43/80]
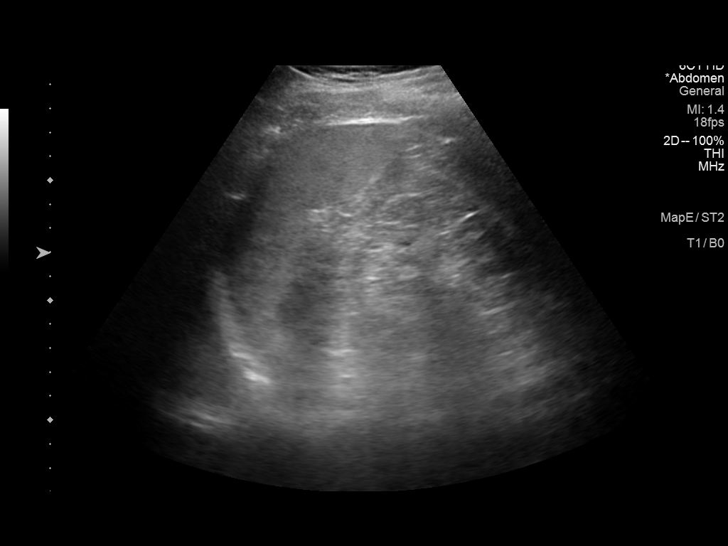
[im 50/80]
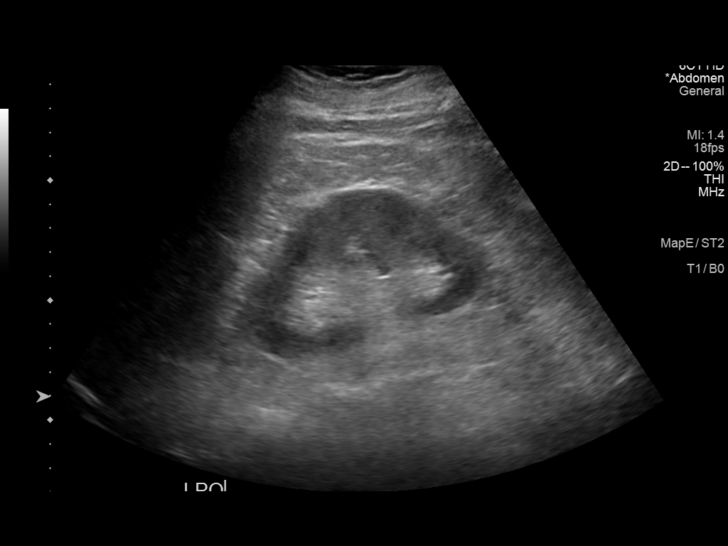
[im 53/80]
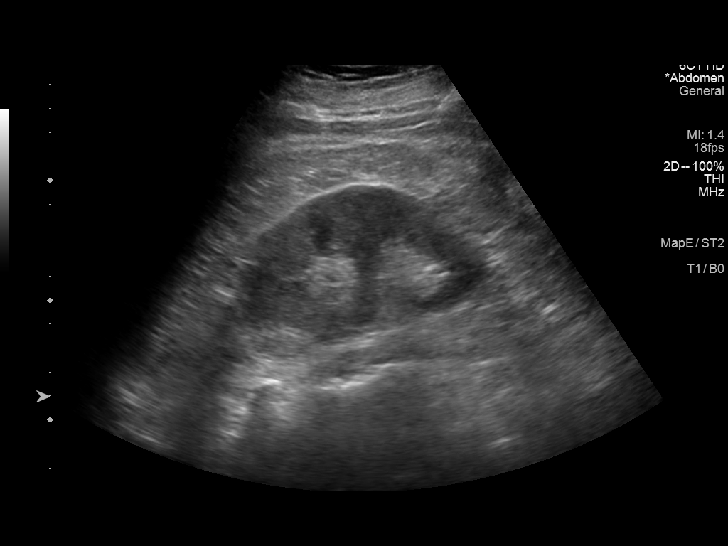
[im 60/80]
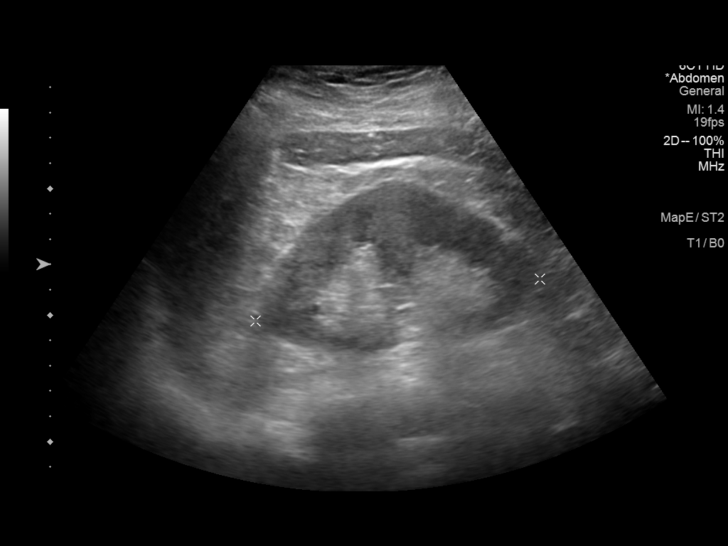
[im 66/80]
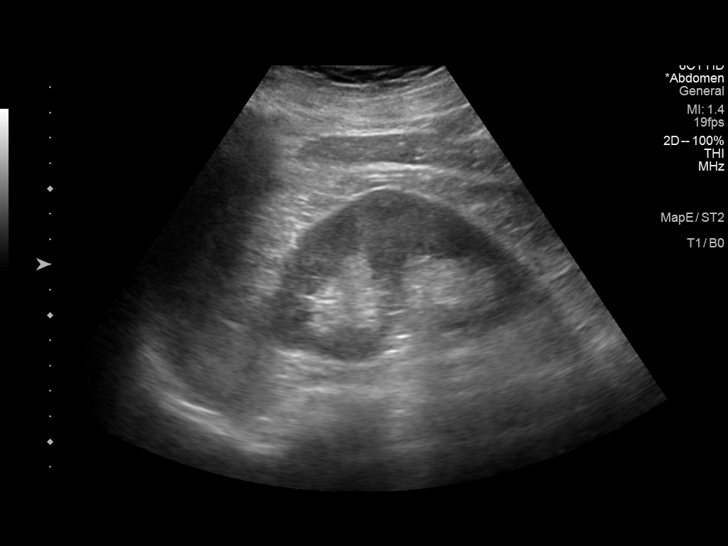
[im 73/80]
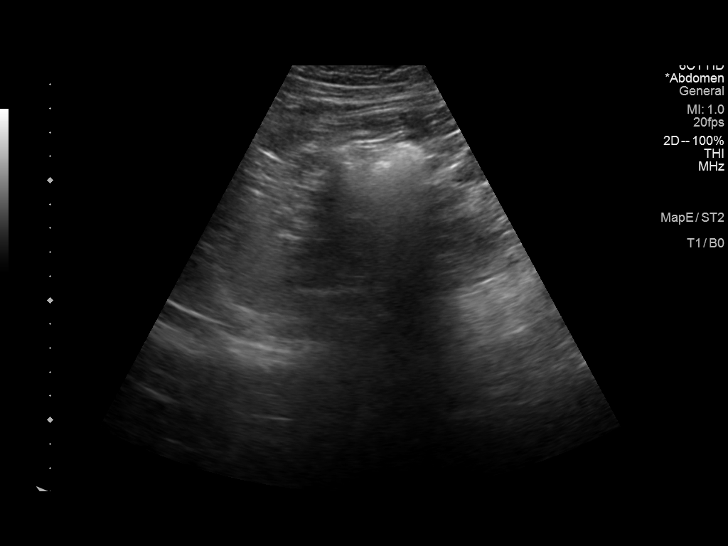
[im 80/80]
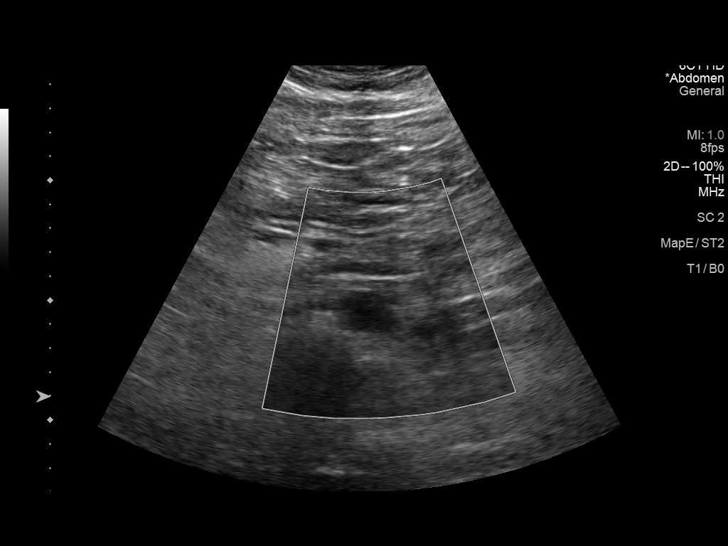

[14 of 25 positions shown; findings below may reference images not displayed]

FINDINGS: Gallbladder: No gallstones or wall thickening visualized. No
sonographic Murphy sign noted by sonographer.

Common bile duct: Diameter: 3.0 mm.

Liver: Increased parenchymal echogenicity compatible with steatosis
without focal mass. Portal vein is patent on color Doppler imaging
with normal direction of blood flow towards the liver.

IVC: No abnormality visualized.

Pancreas: Visualized portion unremarkable.

Spleen: Size and appearance within normal limits.

Right Kidney: Length: 10.4 cm. Echogenicity within normal limits. No
mass or hydronephrosis visualized.

Left Kidney: Length: 11.4 cm. Echogenicity within normal limits. No
mass or hydronephrosis visualized.

Abdominal aorta: No aneurysm visualized.

Other findings: None.
IMPRESSION: 1.  No acute findings.

2.  Mild hepatic steatosis without focal mass.

## 2022-04-22 DIAGNOSIS — M2021 Hallux rigidus, right foot: Secondary | ICD-10-CM | POA: Diagnosis not present

## 2022-04-22 DIAGNOSIS — M79672 Pain in left foot: Secondary | ICD-10-CM | POA: Diagnosis not present

## 2022-04-22 DIAGNOSIS — M79671 Pain in right foot: Secondary | ICD-10-CM | POA: Diagnosis not present

## 2022-04-22 DIAGNOSIS — M2022 Hallux rigidus, left foot: Secondary | ICD-10-CM | POA: Diagnosis not present

## 2022-06-22 DIAGNOSIS — D7589 Other specified diseases of blood and blood-forming organs: Secondary | ICD-10-CM | POA: Diagnosis not present

## 2022-06-22 DIAGNOSIS — E538 Deficiency of other specified B group vitamins: Secondary | ICD-10-CM | POA: Diagnosis not present

## 2022-06-22 DIAGNOSIS — K760 Fatty (change of) liver, not elsewhere classified: Secondary | ICD-10-CM | POA: Diagnosis not present

## 2022-08-05 DIAGNOSIS — M792 Neuralgia and neuritis, unspecified: Secondary | ICD-10-CM | POA: Diagnosis not present

## 2022-09-27 DIAGNOSIS — E538 Deficiency of other specified B group vitamins: Secondary | ICD-10-CM | POA: Diagnosis not present

## 2022-11-19 ENCOUNTER — Other Ambulatory Visit (HOSPITAL_COMMUNITY)
Admission: EM | Admit: 2022-11-19 | Discharge: 2022-11-23 | Disposition: A | Discharge status: Home / Self Care | Payer: BC Managed Care – PPO | Attending: Psychiatry | Admitting: Psychiatry

## 2022-11-19 DIAGNOSIS — F329 Major depressive disorder, single episode, unspecified: Secondary | ICD-10-CM | POA: Insufficient documentation

## 2022-11-19 DIAGNOSIS — F1010 Alcohol abuse, uncomplicated: Secondary | ICD-10-CM | POA: Diagnosis present

## 2022-11-19 DIAGNOSIS — E7800 Pure hypercholesterolemia, unspecified: Secondary | ICD-10-CM | POA: Insufficient documentation

## 2022-11-19 DIAGNOSIS — R45851 Suicidal ideations: Secondary | ICD-10-CM | POA: Insufficient documentation

## 2022-11-19 DIAGNOSIS — F39 Unspecified mood [affective] disorder: Secondary | ICD-10-CM

## 2022-11-19 DIAGNOSIS — Z1152 Encounter for screening for COVID-19: Secondary | ICD-10-CM | POA: Diagnosis not present

## 2022-11-19 LAB — CBC WITH DIFFERENTIAL/PLATELET
Abs Immature Granulocytes: 0.02 10*3/uL (ref 0.00–0.07)
Basophils Absolute: 0.1 10*3/uL (ref 0.0–0.1)
Basophils Relative: 1 %
Eosinophils Absolute: 0 10*3/uL (ref 0.0–0.5)
Eosinophils Relative: 0 %
HCT: 42.2 % (ref 39.0–52.0)
Hemoglobin: 15.1 g/dL (ref 13.0–17.0)
Immature Granulocytes: 0 %
Lymphocytes Relative: 23 %
Lymphs Abs: 1.6 10*3/uL (ref 0.7–4.0)
MCH: 35.5 pg — ABNORMAL HIGH (ref 26.0–34.0)
MCHC: 35.8 g/dL (ref 30.0–36.0)
MCV: 99.3 fL (ref 80.0–100.0)
Monocytes Absolute: 0.4 10*3/uL (ref 0.1–1.0)
Monocytes Relative: 6 %
Neutro Abs: 4.9 10*3/uL (ref 1.7–7.7)
Neutrophils Relative %: 70 %
Platelets: 262 10*3/uL (ref 150–400)
RBC: 4.25 MIL/uL (ref 4.22–5.81)
RDW: 14.6 % (ref 11.5–15.5)
WBC: 7 10*3/uL (ref 4.0–10.5)
nRBC: 0 % (ref 0.0–0.2)

## 2022-11-19 LAB — COMPREHENSIVE METABOLIC PANEL
ALT: 137 U/L — ABNORMAL HIGH (ref 0–44)
AST: 179 U/L — ABNORMAL HIGH (ref 15–41)
Albumin: 3.8 g/dL (ref 3.5–5.0)
Alkaline Phosphatase: 164 U/L — ABNORMAL HIGH (ref 38–126)
Anion gap: 20 — ABNORMAL HIGH (ref 5–15)
BUN: <5 mg/dL — ABNORMAL LOW (ref 6–20)
CO2: 20 mmol/L — ABNORMAL LOW (ref 22–32)
Calcium: 9.3 mg/dL (ref 8.9–10.3)
Chloride: 94 mmol/L — ABNORMAL LOW (ref 98–111)
Creatinine, Ser: 0.78 mg/dL (ref 0.61–1.24)
GFR, Estimated: >60 mL/min (ref >60)
Glucose, Bld: 187 mg/dL — ABNORMAL HIGH (ref 70–99)
Potassium: 4 mmol/L (ref 3.5–5.1)
Sodium: 134 mmol/L — ABNORMAL LOW (ref 135–145)
Total Bilirubin: 0.9 mg/dL (ref 0.3–1.2)
Total Protein: 7.8 g/dL (ref 6.5–8.1)

## 2022-11-19 LAB — LIPID PANEL
Cholesterol: 231 mg/dL — ABNORMAL HIGH (ref 0–200)
HDL: 37 mg/dL — ABNORMAL LOW (ref >40)
LDL Cholesterol: 152 mg/dL — ABNORMAL HIGH (ref 0–99)
Total CHOL/HDL Ratio: 6.2 RATIO
Triglycerides: 212 mg/dL — ABNORMAL HIGH (ref <150)
VLDL: 42 mg/dL — ABNORMAL HIGH (ref 0–40)

## 2022-11-19 LAB — HEMOGLOBIN A1C
Hgb A1c MFr Bld: 7.8 % — ABNORMAL HIGH (ref 4.8–5.6)
Mean Plasma Glucose: 177.16 mg/dL

## 2022-11-19 LAB — SARS CORONAVIRUS 2 BY RT PCR: SARS Coronavirus 2 by RT PCR: NEGATIVE

## 2022-11-19 LAB — ETHANOL: Alcohol, Ethyl (B): 152 mg/dL — ABNORMAL HIGH (ref <10)

## 2022-11-19 LAB — TSH: TSH: 2.337 u[IU]/mL (ref 0.350–4.500)

## 2022-11-19 MED ORDER — GABAPENTIN 300 MG PO CAPS
300.0000 mg | ORAL_CAPSULE | Freq: Three times a day (TID) | ORAL | Status: DC
  Administered 2022-11-19 – 2022-11-23 (×12): 300 mg via ORAL
  Filled 2022-11-19 (×13): qty 1

## 2022-11-19 MED ORDER — ZOLPIDEM TARTRATE 5 MG PO TABS: 10.0000 mg | ORAL_TABLET | Freq: Every evening | ORAL | Status: DC | PRN

## 2022-11-19 MED ORDER — ONDANSETRON 4 MG PO TBDP
4.0000 mg | ORAL_TABLET | Freq: Four times a day (QID) | ORAL | Status: AC | PRN
  Administered 2022-11-21: 4 mg via ORAL
  Filled 2022-11-19: qty 1

## 2022-11-19 MED ORDER — THIAMINE HCL 100 MG/ML IJ SOLN
100.0000 mg | Freq: Once | INTRAMUSCULAR | Status: AC
  Administered 2022-11-19: 100 mg via INTRAMUSCULAR
  Filled 2022-11-19: qty 2

## 2022-11-19 MED ORDER — THIAMINE MONONITRATE 100 MG PO TABS
100.0000 mg | ORAL_TABLET | Freq: Every day | ORAL | Status: DC
  Administered 2022-11-20 – 2022-11-23 (×4): 100 mg via ORAL
  Filled 2022-11-19 (×4): qty 1

## 2022-11-19 MED ORDER — LOPERAMIDE HCL 2 MG PO CAPS: 2.0000 mg | ORAL_CAPSULE | ORAL | Status: AC | PRN

## 2022-11-19 MED ORDER — METOPROLOL SUCCINATE ER 50 MG PO TB24
50.0000 mg | ORAL_TABLET | Freq: Every day | ORAL | Status: DC
  Administered 2022-11-20 – 2022-11-23 (×4): 50 mg via ORAL
  Filled 2022-11-19 (×4): qty 1

## 2022-11-19 MED ORDER — HYDROXYZINE HCL 25 MG PO TABS
25.0000 mg | ORAL_TABLET | Freq: Four times a day (QID) | ORAL | Status: AC | PRN
  Administered 2022-11-19: 25 mg via ORAL
  Filled 2022-11-19 (×2): qty 1

## 2022-11-19 MED ORDER — LORAZEPAM 1 MG PO TABS: 1.0000 mg | ORAL_TABLET | Freq: Four times a day (QID) | ORAL | Status: AC | PRN

## 2022-11-19 MED ORDER — ADULT MULTIVITAMIN W/MINERALS CH
1.0000 | ORAL_TABLET | Freq: Every day | ORAL | Status: DC
  Administered 2022-11-19 – 2022-11-23 (×5): 1 via ORAL
  Filled 2022-11-19 (×5): qty 1

## 2022-11-19 MED ORDER — ACETAMINOPHEN 325 MG PO TABS: 650.0000 mg | ORAL_TABLET | Freq: Four times a day (QID) | ORAL | Status: DC | PRN

## 2022-11-19 NOTE — ED Notes (Signed)
Pt is currently sleeping, no distress noted, environmental check complete, will continue to monitor patient for safety.  

## 2022-11-19 NOTE — BH Assessment (Signed)
Comprehensive Clinical Assessment (CCA) Note  11/19/2022 JRU PENSE [id number]  Disposition: Logan Heritage, NP, determined patient meets criteria for inpatient psychiatric treatment.  The patient demonstrates the following risk factors for suicide: Chronic risk factors for suicide include: substance use disorder. Acute risk factors for suicide include:  father, brother and mother all passed away within 3 years of one another. . Protective factors for this patient include: positive social support. Considering these factors, the overall suicide risk at this point appears to be low. Patient is appropriate for outpatient follow up.  Logan Martinez is a 57 year old male who presents voluntarily to [facility] accompanied by his wife  due to passive suicidal ideations and alcohol use. Patient reports having SI in the past but denies having any today. He denies any previous attempts of suicide.    He said it has been hard for him the past few years, since his  father passed away 2019/07/31,  his brother passed away October 29, 2019. Pts mother was taken off of life support in October of 2023. Patient denies  HI, AVH or other symptoms of psychosis.  Patient reports consuming at least a quart of wine daily.  Recently he admitted that he now consumes 2 quarts of wine daily instead of one.  Patient lives with his wife of 32 years.  They have 1 adult son.  Patient reports being raised by both parents and denies  experiencing any neglect abuse domestic violence or abuse. Patient is currently unemployed.  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 Patient reported having a firearm in the home that is secure.  Per NP Logan Martinez, wife agreed to remove form the home for now.  Patient does not have a previous mental health diagnosis.   He does not have a psychiatrist., therapist, or any inpatient history.    MSE: patient is casually dressed, alert, x oriented  x5 with soft speech.  Pt walks with a limp.  He was  brought in by wife ina wheel chair. . Eye contact is good. Patient's mood is depressed and and affect congruent with mood. Thought process is coherent and relevant. There is no indication patient is currently responding to internal stimuli or experiencing delusional thought content. Patient was cooperative throughout assessment.   Chief Complaint:  Chief Complaint  Patient presents with   Depression   Visit Diagnosis: Alcohol abuse Suicidal Ideations    CCA Screening, Triage and Referral (STR)  Patient Reported Information How did you hear about [place]? No data recorded What Is the Reason for Your Visit/Call Today? Pt presents to [facility] voluntarily accompanied by his wife due to ongoing depression symptoms. Pt reports passive SI "sometimes I don't want to go on living". Pt reports grief from his father and brother dying and reports ongoing depression symptoms associated with their death. Pt reports using more of his ambien then prescribed. Pts wife reports the pt texted her today and told her that he is thinking of ways to "get it over with and can't stop crying". Pt reports drinking a quart of wine this morning and lastnight. Pt reports drinking atleast 2 quarts of wine daily for several years. Pts wife states that the pts father passed away Jul 31, 2019, and his brother passed away 10/29/19. Pts mother was taken off of life support of October of 2023.Pt denies SI/HI and AVH at this time.  How Long Has This Been Causing You Problems? > than 6 months  What Do You Feel Would  Help You the Most Today? Treatment for Depression or other mood problem   Have You Recently Had Any Thoughts About Hurting Yourself? No  Are You Planning to Commit Suicide/Harm Yourself At This time? No   Flowsheet Row ED from 11/19/2022 in [facility]  C-SSRS RISK CATEGORY Low Risk       Have you Recently Had Thoughts About Hurting Someone Logan Martinez? No  Are You Planning to Harm Someone  at This Time? No  Explanation: N/A   Have You Used Any Alcohol or Drugs in the Past 24 Hours? Yes  What Did You Use and How Much? quart of wine   Do You Currently Have a Therapist/Psychiatrist? No  Name of Therapist/Psychiatrist: Name of Therapist/Psychiatrist: No previous psychiatric services   Have You Been Recently Discharged From Any Office Practice or Programs? No  Explanation of Discharge From Practice/Program: N/A     CCA Screening Triage Referral Assessment Type of Contact: Face-to-Face  Telemedicine Service Delivery:   Is this Initial or Reassessment?   Date Telepsych consult ordered in [facility]:    Time Telepsych consult ordered in [facility]:    Location of Assessment: [facility]  Provider Location: [facility] Assessment Services   Collateral Involvement: Wife-Logan Martinez   Does Patient Have a Automotive engineer Guardian? No  Legal Guardian Contact Information: NA  Copy of Legal Guardianship Form: -- (NA-no legal guardian)  Legal Guardian Notified of Arrival: -- (No legal guardian)  Legal Guardian Notified of Pending Discharge: -- (NA)  If Minor and Not Living with Parent(s), Who has Custody? NA  Is CPS involved or ever been involved? Never  Is APS involved or ever been involved? Never   Patient Determined To Be At Risk for Harm To Self or Others Based on Review of Patient Reported Information or Presenting Complaint? No  Method: No Plan  Availability of Means: No access or NA  Intent: Vague intent or NA  Notification Required: -- (NA)  Additional Information for Danger to Others Potential: -- (NA)  Additional Comments for Danger to Others Potential: NA  Are There Guns or Other Weapons in Your Home? Yes  Types of Guns/Weapons: pistol  Are These Weapons Safely Secured?                            Yes  Who Could Verify You Are Able To Have These Secured: Wife-Logan Alrdridge  Do You Have any Outstanding Charges, Pending  Court Dates, Parole/Probation? No  Contacted To Inform of Risk of Harm To Self or Others: -- (NA)    Does Patient Present under Involuntary Commitment? No    [place] of Residence: [place]   Patient Currently Receiving the Following Services: Not Receiving Services   Determination of Need: Urgent (48 hours)   Options For Referral: Outpatient Therapy; Medication Management; BH Urgent Care     CCA Biopsychosocial Patient Reported Schizophrenia/Schizoaffective Diagnosis in Past: No   Strengths: Friendly   Mental Health Symptoms Depression:   Hopelessness; Sleep (too much or little); Change in energy/activity   Duration of Depressive symptoms:  Duration of Depressive Symptoms: Greater than two weeks   Mania:   None   Anxiety:    None   Psychosis:   None   Duration of Psychotic symptoms:    Trauma:   None   Obsessions:   None   Compulsions:   N/A   Inattention:   N/A   Hyperactivity/Impulsivity:  N/A   Oppositional/Defiant Behaviors:   N/A   Emotional Irregularity:   Chronic feelings of emptiness   Other Mood/Personality Symptoms:   NA    Mental Status Exam Appearance and self-care  Stature:   Average   Weight:   Average weight   Clothing:   Casual   Grooming:   Normal   Cosmetic use:   None   Posture/gait:   Normal   Motor activity:   Not Remarkable   Sensorium  Attention:   Normal   Concentration:   Normal   Orientation:   X5   Recall/memory:   Normal   Affect and Mood  Affect:  No data recorded  Mood:   Depressed   Relating  Eye contact:   Normal   Facial expression:   Depressed; Sad   Attitude toward examiner:   Cooperative   Thought and Language  Speech flow:  Clear and Coherent   Thought content:   Appropriate to Mood and Circumstances   Preoccupation:  No data recorded  Hallucinations:   None   Organization:   Coherent   [employer] of Knowledge:   Average    Intelligence:   Average   Abstraction:   Functional   Judgement:   Normal   Reality Testing:   Adequate   Insight:   Good   Decision Making:   Normal   Social Functioning  Social Maturity:   Responsible   Social Judgement:   Normal   Stress  Stressors:   Grief/losses   Coping Ability:   Human resources officer Deficits:   None   Supports:   Family     Religion: Religion/Spirituality Are You A Religious Person?: No How Might This Affect Treatment?: NA  Leisure/Recreation: Leisure / Recreation Do You Have Hobbies?: No  Exercise/Diet: Exercise/Diet Have You Gained or Lost A Significant Amount of Weight in the Past Six Months?: No Do You Follow a Special Diet?: No Do You Have Any Trouble Sleeping?: Yes Explanation of Sleeping Difficulties: Client has difficulty sleeping and has begun to take larger than prescribed doses of ambien.   CCA Employment/Education Employment/Work Situation: Employment / Work Situation Employment Situation: Unemployed Patient's Job has Been Impacted by Current Illness: No Has Patient ever Been in Equities trader?: No  Education: Education Is Patient Currently Attending School?: No Last Grade Completed: 12 Did You Product manager?: No Did You Have An Individualized Education Program (IIEP): No Did You Have Any Difficulty At [employer]?: No Patient's Education Has Been Impacted by Current Illness: No   CCA Family/Childhood History Family and Relationship History: Family history Marital status: Married Number of Years Married: 32 What types of issues is patient dealing with in the relationship?: normal problems in marriage Additional relationship information: None Does patient have children?: Yes How many children?: 1 How is patient's relationship with their children?: good  Childhood History:  Childhood History By whom was/is the patient raised?: Both parents Did patient suffer any verbal/emotional/physical/sexual abuse  as a child?: No Did patient suffer from severe childhood neglect?: No Has patient ever been sexually abused/assaulted/raped as an adolescent or adult?: No Was the patient ever a victim of a crime or a disaster?: No Witnessed domestic violence?: No Has patient been affected by domestic violence as an adult?: No       CCA Substance Use Alcohol/Drug Use: Alcohol / Drug Use Pain Medications: Gabapentin Prescriptions: ambein, metoprolol Over the Counter: none History of alcohol / drug use?: Yes Longest period of  sobriety (when/how long): 5-6 days Negative Consequences of Use: Legal (Pt  was charged with DUI last year. Had license suspended, paid a fine and attend classes.) Withdrawal Symptoms: None Substance #1 Name of Substance 1: Alcohol 1 - Age of First Use: [age] 1 - Amount (size/oz): 1-2 1 - Frequency: daily 1 - Duration: 3 years 1 - Last Use / Amount: this morning/quart of wine 1 - Method of Aquiring: pruchase 1- Route of Use: drinking                       ASAM's:  Six Dimensions of Multidimensional Assessment  Dimension 1:  Acute Intoxication and/or Withdrawal Potential:   Dimension 1:  Description of individual's past and current experiences of substance use and withdrawal: no withdrawal symptoms reported  Dimension 2:  Biomedical Conditions and Complications:   Dimension 2:  Description of patient's biomedical conditions and  complications: hig blood pressure, walks with lmp  Dimension 3:  Emotional, Behavioral, or Cognitive Conditions and Complications:  Dimension 3:  Description of emotional, behavioral, or cognitive conditions and complications: Pt is having more thoughts about wanting to end the mental pain he is experiencing.  Dimension 4:  Readiness to Change:  Dimension 4:  Description of Readiness to Change criteria: Pt would like help to feel better  Dimension 5:  Relapse, Continued use, or Continued Problem Potential:  Dimension 5:  Relapse, continued  use, or continued problem potential critiera description: has never attempted to stop  Dimension 6:  Recovery/Living Environment:  Dimension 6:  Recovery/Iiving environment criteria description: wife is supportive  ASAM Severity Score:    ASAM Recommended Level of Treatment: ASAM Recommended Level of Treatment: Level I Outpatient Treatment   Substance use Disorder (SUD) Substance Use Disorder (SUD)  Checklist Symptoms of Substance Use: Evidence of tolerance  Recommendations for Services/Supports/Treatments: Recommendations for Services/Supports/Treatments Recommendations For Services/Supports/Treatments: Individual Therapy, Medication Management, SAIOP (Substance Abuse Intensive Outpatient Program)  Discharge Disposition:    DSM5 Diagnoses: There are no problems to display for this patient.    Referrals to Alternative Service(s): Referred to Alternative Service(s):   Place:   Date:   Time:    Referred to Alternative Service(s):   Place:   Date:   Time:    Referred to Alternative Service(s):   Place:   Date:   Time:    Referred to Alternative Service(s):   Place:   Date:   Time:     Donnamae Jude, LCSW

## 2022-11-19 NOTE — ED Notes (Signed)
Patient was admitted to OBS. Patient denies SI/HI and AVH. Patient has been calm and cooperative while on the unit. Patient was offered a snack and stated he would wait until dinner time. Patient will be monitored for safety.

## 2022-11-19 NOTE — ED Provider Notes (Cosign Needed Addendum)
[facility] Continuous Assessment Admission H&P  Date: 11/19/22 Patient Name: Logan Martinez MRN: [id number] Chief Complaint: "Really bad couple of weeks. I don't want to do it anymore."  Diagnoses:  Final diagnoses:  Alcohol abuse  Suicidal ideations   HPI: Pt presents voluntarily to [facility] behavioral health for walk-in assessment.  Pt is accompanied by his wife, [name], who he requests remain for the assessment. Pt is assessed face-to-face by nurse practitioner.   Philis Nettle, 56 y.o., male patient seen face to face by this provider; and chart reviewed on 11/19/22.  On evaluation, when asked reason for presenting today, Logan Martinez reports "really bad couple of weeks. I don't want to do it anymore".   Pt reports father passed away 3 years ago. His younger brother passed away 2 years ago. His mother was taken off of life support in October 2023.    Per pt's wife, pt texted her today that he is thinking of ways to "get it over with and can't stop crying". This was uncharacteristic of him and prompted her to bring pt to this facility for psychiatric evaluation. When asked about this, pt reports he was "crying out for help". He reports having thoughts at the time that he wanted to die. He denies have any plan, although reports "thinking anything I can do to get it over with". He reports he took 2  Ambien and drank alcohol. He denies this was suicide attempt and was an attempt to "try to sleep" so he would stop feeling how he is feeling. Discussed w/ pt risks of drinking alcohol and taking ambien together. Pt minimizing risks.   Pt denies homicidal or violent ideations. He denies auditory visual hallucinations or paranoia.   Pt denies history of non suicidal self injurious behavior, suicide attempt or inpatient psychiatric hospitalization.  Pt reports using 2 quarts of wine/day for the past 2 years. Per pt's wife, he has been using alcohol daily for a much longer period  of time. They deny knowledge of withdrawal seizures or delirium tremens. Pt's wife states he has never attempted sobriety before. Pt reports use of "dip" nicotine. He denies of marijuana, crack/cocaine, methamphetamine, other substances.   Pt denies he is connected with medication management or counseling.  Pt reports he is unemployed.  Pt is living with his wife.  There is a firearm in the home. Pt's wife is currently keeping it secured. Discussed removing firearm from home for the time being. Pt and pt's wife agree with removing firearm from home for the time being.   Discussed w/ pt and pt's wife inpatient psychiatric admission. They agree with recommendation for inpatient psychiatric admission.   Total Time spent with patient: 45 minutes  Musculoskeletal  Strength & Muscle Tone: within normal limits Gait & Station:  walks with limp Patient leans:  walks with limp  Psychiatric Specialty Exam  Presentation General Appearance:  Casual; Appropriate for Environment  Eye Contact: Fair  Speech: Clear and Coherent; Normal Rate  Speech Volume: Normal  Handedness: Right   Mood and Affect  Mood: Dysphoric; Depressed  Affect: Blunt   Thought Process  Thought Processes: Coherent; Goal Directed; Linear  Descriptions of Associations:Intact  Orientation:Full (Time, Place and Person)  Thought Content:Logical    Hallucinations:Hallucinations: None  Ideas of Reference:None  Suicidal Thoughts:Suicidal Thoughts: Yes, Passive  Homicidal Thoughts:Homicidal Thoughts: No   Sensorium  Memory: Immediate Fair  Judgment: Intact  Insight: Present   Executive Functions  Concentration: Fair  Attention  Span: Fair  Recall: [employer] of Knowledge: Fair  Language: Fair   Psychomotor Activity  Psychomotor Activity: Psychomotor Activity: Normal   Assets  Assets: Manufacturing systems engineer; Desire for Improvement; Financial Resources/Insurance; Housing;  Intimacy; Resilience; Social Support   Sleep  Sleep: Sleep: Poor   Nutritional Assessment (For OBS and [facility]BC admissions only) Has the patient had a weight loss or gain of 10 pounds or more in the last 3 months?: No Has the patient had a decrease in food intake/or appetite?: No Does the patient have dental problems?: No Does the patient have eating habits or behaviors that may be indicators of an eating disorder including binging or inducing vomiting?: No Has the patient recently lost weight without trying?: 0 Has the patient been eating poorly because of a decreased appetite?: 0 Malnutrition Screening Tool Score: 0    Physical Exam Constitutional:      General: He is not in acute distress.    Appearance: He is not ill-appearing, toxic-appearing or diaphoretic.  Eyes:     General: No scleral icterus. Cardiovascular:     Rate and Rhythm: Normal rate.  Pulmonary:     Effort: Pulmonary effort is normal. No respiratory distress.  Skin:    General: Skin is warm and dry.  Neurological:     Mental Status: He is alert and oriented to person, place, and time.  Psychiatric:        Attention and Perception: Attention and perception normal.        Mood and Affect: Mood is depressed. Affect is blunt.        Speech: Speech normal.        Behavior: Behavior normal. Behavior is cooperative.        Thought Content: Thought content includes suicidal ideation.        Cognition and Memory: Cognition and memory normal.    Review of Systems  Constitutional:  Negative for chills and fever.  Respiratory:  Negative for shortness of breath.   Cardiovascular:  Negative for chest pain and palpitations.  Gastrointestinal:  Negative for abdominal pain.  Neurological:  Negative for headaches.  Psychiatric/Behavioral:  Positive for depression, substance abuse and suicidal ideas.     Blood pressure (!) 124/99, pulse 100, temperature 98.1 F (36.7 C), temperature source Oral, resp. rate 18, SpO2 96 %.  There is no height or weight on file to calculate BMI.  Past Psychiatric History: Alcohol abuse   Is the patient at risk to self? Yes  Has the patient been a risk to self in the past 6 months? No .    Has the patient been a risk to self within the distant past? No   Is the patient a risk to others? No   Has the patient been a risk to others in the past 6 months? No   Has the patient been a risk to others within the distant past? No   Past Medical History: Hypercholesteremia  Family History: None reported  Social History: Unemployed, living with wife  Last Labs:  No visits with results within 6 Month(s) from this visit.  Latest known visit with results is:  Admission on 10/24/2013, Discharged on 10/24/2013  Component Date Value Ref Range Status   WBC 10/24/2013 7.5  4.0 - 10.5 K/uL Final   RBC 10/24/2013 4.66  4.22 - 5.81 MIL/uL Final   Hemoglobin 10/24/2013 15.5  13.0 - 17.0 g/dL Final   HCT 09/81/1914 42.5  39.0 - 52.0 % Final   MCV  10/24/2013 91.2  78.0 - 100.0 fL Final   MCH 10/24/2013 33.3  26.0 - 34.0 pg Final   MCHC 10/24/2013 36.5 (H)  30.0 - 36.0 g/dL Final   RDW 78/67/5449 12.1  11.5 - 15.5 % Final   Platelets 10/24/2013 207  150 - 400 K/uL Final   Sodium 10/24/2013 136 (L)  137 - 147 mEq/L Final   Potassium 10/24/2013 4.1  3.7 - 5.3 mEq/L Final   Chloride 10/24/2013 97  96 - 112 mEq/L Final   CO2 10/24/2013 21  19 - 32 mEq/L Final   Glucose, Bld 10/24/2013 115 (H)  70 - 99 mg/dL Final   BUN 20/05/711 7  6 - 23 mg/dL Final   Creatinine, Ser 10/24/2013 0.74  0.50 - 1.35 mg/dL Final   Calcium 19/75/8832 9.6  8.4 - 10.5 mg/dL Final   GFR calc non Af Amer 10/24/2013 >90  >90 mL/min Final   GFR calc Af Amer 10/24/2013 >90  >90 mL/min Final   Comment: (NOTE)                          The eGFR has been calculated using the CKD EPI equation.                          This calculation has not been validated in all clinical situations.                          eGFR's  persistently <90 mL/min signify possible Chronic Kidney                          Disease.   Troponin i, poc 10/24/2013 0.00  0.00 - 0.08 ng/mL Final   Comment 3 10/24/2013          Final   Comment: Due to the release kinetics of cTnI,                          a negative result within the first hours                          of the onset of symptoms does not rule out                          myocardial infarction with certainty.                          If myocardial infarction is still suspected,                          repeat the test at appropriate intervals.    Allergies: Patient has no known allergies.  Medications:  Facility Ordered Medications  Medication   acetaminophen (TYLENOL) tablet 650 mg   thiamine (VITAMIN B1) injection 100 mg   [START ON 11/20/2022] thiamine (VITAMIN B1) tablet 100 mg   multivitamin with minerals tablet 1 tablet   LORazepam (ATIVAN) tablet 1 mg   hydrOXYzine (ATARAX) tablet 25 mg   loperamide (IMODIUM) capsule 2-4 mg   ondansetron (ZOFRAN-ODT) disintegrating tablet 4 mg   gabapentin (NEURONTIN) tablet 300 mg   [START ON 11/20/2022] metoprolol succinate (TOPROL-XL) 24 hr tablet 50 mg   [  START ON 11/20/2022] zolpidem (AMBIEN) tablet 10 mg   PTA Medications  Medication Sig   atorvastatin (LIPITOR) 40 MG tablet Take 40 mg by mouth daily.   zolpidem (AMBIEN) 10 MG tablet Take 10 mg by mouth at bedtime.   Pseudoephedrine-Ibuprofen (ADVIL COLD/SINUS PO) Take 1 capsule by mouth 2 (two) times daily as needed.   HYDROcodone-acetaminophen (NORCO/VICODIN) 5-325 MG tablet Take 1 tablet by mouth every 6 (six) hours as needed for severe pain.   Medical Decision Making  Pt is a 56 y/o male w/ history of alcohol abuse, hypercholesteremia presenting to [facility] today with his wife for suicidal ideations. Pt reports earlier today drinking alcohol and taking 2 ambien. Pt recommended for inpatient psychiatric admission.   Meds ordered this encounter  Medications    acetaminophen (TYLENOL) tablet 650 mg   thiamine (VITAMIN B1) injection 100 mg   thiamine (VITAMIN B1) tablet 100 mg   multivitamin with minerals tablet 1 tablet   LORazepam (ATIVAN) tablet 1 mg   hydrOXYzine (ATARAX) tablet 25 mg   loperamide (IMODIUM) capsule 2-4 mg   ondansetron (ZOFRAN-ODT) disintegrating tablet 4 mg   gabapentin (NEURONTIN) tablet 300 mg   metoprolol succinate (TOPROL-XL) 24 hr tablet 50 mg   zolpidem (AMBIEN) tablet 10 mg   Lab Orders         SARS Coronavirus 2 by RT PCR (hospital order, performed in [facility] hospital lab) *cepheid single result test* Anterior Nasal Swab         CBC with Differential/Platelet         Comprehensive metabolic panel         Hemoglobin A1c         Ethanol         Lipid panel         TSH         POCT Urine Drug Screen - (I-Screen)     Recommendations  Based on my evaluation the patient does not appear to have an emergency medical condition.  Lauree Chandler, NP 11/19/22  3:22 PM

## 2022-11-19 NOTE — ED Provider Notes (Signed)
Pt's wife, [name], phone number is [phone].

## 2022-11-19 NOTE — Progress Notes (Signed)
   11/19/22 1319  [facility] Triage Screening (Walk-ins at [facility] only)  What Is the Reason for Your Visit/Call Today? Pt presents to [facility] voluntarily accompanied by his wife due to ongoing depression symptoms. Pt reports passive SI "sometimes I don't want to go on living". Pt reports grief from his father and brother dying and reports ongoing depression symptoms associated with their death. Pt reports using more of his ambien then prescribed. Pts wife reports the pt texted her today and told her that he is thinking of ways to "get it over with and can't stop crying". Pt reports drinking a quart of wine this morning and lastnight. Pt reports drinking atleast 2 quarts of wine daily for several years. Pts wife states that the pts father passed away 08/16/2019, and his brother passed away 2019-11-14. Pts mother was taken off of life support of October of 2023.Pt denies SI/HI and AVH at this time.  How Long Has This Been Causing You Problems? > than 6 months  Have You Recently Had Any Thoughts About Hurting Yourself? No  Are You Planning to Commit Suicide/Harm Yourself At This time? No  Have you Recently Had Thoughts About Hurting Someone [name]? No  Are You Planning To Harm Someone At This Time? No  Are you currently experiencing any auditory, visual or other hallucinations? No  Have You Used Any Alcohol or Drugs in the Past 24 Hours? Yes  How long ago did you use Drugs or Alcohol? today  What Did You Use and How Much? quart of wine  Do you have any current medical co-morbidities that require immediate attention? No  Clinician description of patient physical appearance/behavior: using wheel chair to ambulate  What Do You Feel Would Help You the Most Today? Treatment for Depression or other mood problem  If access to [facility] was not available, would you have sought care in the Emergency Department? No  Determination of Need Routine (7 days)  Options For Referral Outpatient Therapy;Medication  Management;BH Urgent Care

## 2022-11-20 ENCOUNTER — Encounter (HOSPITAL_COMMUNITY): Payer: Self-pay | Admitting: Psychiatry

## 2022-11-20 DIAGNOSIS — F1010 Alcohol abuse, uncomplicated: Secondary | ICD-10-CM | POA: Diagnosis present

## 2022-11-20 LAB — POCT URINE DRUG SCREEN - MANUAL ENTRY (I-SCREEN)
POC Amphetamine UR: NOT DETECTED
POC Buprenorphine (BUP): NOT DETECTED
POC Cocaine UR: NOT DETECTED
POC Marijuana UR: NOT DETECTED
POC Methadone UR: NOT DETECTED
POC Methamphetamine UR: NOT DETECTED
POC Morphine: NOT DETECTED
POC Oxazepam (BZO): NOT DETECTED
POC Oxycodone UR: NOT DETECTED
POC Secobarbital (BAR): NOT DETECTED

## 2022-11-20 MED ORDER — NAPROXEN 500 MG PO TABS
500.0000 mg | ORAL_TABLET | Freq: Three times a day (TID) | ORAL | Status: DC | PRN
  Administered 2022-11-21: 500 mg via ORAL
  Filled 2022-11-20 (×2): qty 1

## 2022-11-20 MED ORDER — ZOLPIDEM TARTRATE 5 MG PO TABS
5.0000 mg | ORAL_TABLET | Freq: Every evening | ORAL | Status: DC | PRN
  Administered 2022-11-21 – 2022-11-22 (×2): 5 mg via ORAL
  Filled 2022-11-20 (×2): qty 1

## 2022-11-20 MED ORDER — TRAZODONE HCL 50 MG PO TABS: 50.0000 mg | ORAL_TABLET | Freq: Every evening | ORAL | Status: DC | PRN

## 2022-11-20 MED ORDER — ATORVASTATIN CALCIUM 40 MG PO TABS
40.0000 mg | ORAL_TABLET | Freq: Every day | ORAL | Status: DC
  Administered 2022-11-20 – 2022-11-23 (×4): 40 mg via ORAL
  Filled 2022-11-20 (×4): qty 1

## 2022-11-20 NOTE — ED Provider Notes (Signed)
Behavioral Health Progress Note  Date and Time: 11/20/2022 2:04 PM Name: Logan Martinez MRN:  [id number]  Subjective:  Pt is a 57 y/o male w/ history of alcohol abuse, hypercholesteremia, presenting to [facility] on 11/19/22 with his wife for suicidal ideations, had earlier in the day drank alcohol and taken 2 ambien.   Upon arrival to [facility] patient admits that the combination of Ambien and Etoh was a SA. He reports "at the time I did not want to wake up." He reports that he had been feeling down and depressed with SI for 2 weeks. He denies these feelings currently, but endorses he is tired of his Etoh being an issue and wants to address it. He denies any hx of withdrawal and reports that approx 3 months ago he quit drinking on his own and was able to maintain sobriety for 2 weeks. He does not go into detail about stressors related to sudden acuity in depression. He denies any hx of rehab or detox before. He endorses that his drinking is a huge problem and led to being fired in 01/2022 from his last job were he also obtained a DUI. He also endorses that it is negatively impacting his marriage.   He reports that bother before and after his 2 weeks of sobriety he has been drinking approx 1 quart of wine/ day. He denies any other illicit substance use and does not smoke. He reports that he has been on Ambien for at least 8 years. Per intake H&P note wife has endorsed concern that patient is abusing the medication. He reports that he thinks he has been sleeping fairly well but does endorse significant decline in appetite.    Currently he is denying SI, HI, and AVH.   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 Prescriptions: ambein, metoprolol Over the Counter: none History of alcohol / drug use?: Yes Longest period of sobriety (when/how long): 5-6 days Negative Consequences of Use: Legal (Pt  was charged with DUI last year. Had license suspended, paid a fine and attend classes.) Withdrawal Symptoms: None Name of Substance 1: Alcohol 1 - Age of First Use: [age] 1 - Amount (size/oz): 1-2 1 - Frequency: daily 1 - Duration: 3 years 1 - Last Use / Amount: this morning/quart of wine 1 - Method of Aquiring: pruchase 1- Route of Use: drinking                  Sleep: Good  Appetite:  Poor  Current Medications:  Current Facility-Administered Medications  Medication Dose Route Frequency Provider Last Rate Last Admin   acetaminophen (TYLENOL) tablet 650 mg  650 mg Oral Q6H PRN Lauree Chandler, NP       atorvastatin (LIPITOR) tablet 40 mg  40 mg Oral Daily Eliseo Gum B, MD       gabapentin (NEURONTIN) capsule 300 mg  300 mg Oral TID Lauree Chandler, NP   300 mg at 11/20/22 1036   hydrOXYzine (  ATARAX) tablet 25 mg  25 mg Oral Q6H PRN Lauree Chandler, NP   25 mg at 11/19/22 2110   loperamide (IMODIUM) capsule 2-4 mg  2-4 mg Oral PRN Lauree Chandler, NP       LORazepam (ATIVAN) tablet 1 mg  1 mg Oral Q6H PRN Lauree Chandler, NP       metoprolol succinate (TOPROL-XL) 24 hr tablet 50 mg  50 mg Oral Daily Lauree Chandler, NP   50 mg at 11/20/22 1036   multivitamin with minerals tablet 1 tablet  1 tablet Oral Daily Lauree Chandler, NP   1 tablet at 11/20/22 1036   ondansetron (ZOFRAN-ODT) disintegrating tablet 4 mg  4 mg Oral Q6H PRN Lauree Chandler, NP       thiamine (VITAMIN B1) tablet 100 mg  100 mg Oral Daily Lauree Chandler, NP   100 mg at 11/20/22 1036   traZODone  (DESYREL) tablet 50 mg  50 mg Oral QHS PRN Lauree Chandler, NP       zolpidem (AMBIEN) tablet 5 mg  5 mg Oral QHS PRN Lauree Chandler, NP       Current Outpatient Medications  Medication Sig Dispense Refill   gabapentin (NEURONTIN) 300 MG capsule Take 300 mg by mouth 4 (four) times daily.     metoprolol succinate (TOPROL-XL) 50 MG 24 hr tablet Take 50 mg by mouth daily.     atorvastatin (LIPITOR) 40 MG tablet Take 40 mg by mouth daily.     zolpidem (AMBIEN) 10 MG tablet Take 10 mg by mouth at bedtime.      Labs  Lab Results:  Admission on 11/19/2022  Component Date Value Ref Range Status   SARS Coronavirus 2 by RT PCR 11/19/2022 NEGATIVE  NEGATIVE Final   Performed at [facility], [address] [place], [place] [address]   WBC 11/19/2022 7.0  4.0 - 10.5 K/uL Final   RBC 11/19/2022 4.25  4.22 - 5.81 MIL/uL Final   Hemoglobin 11/19/2022 15.1  13.0 - 17.0 g/dL Final   HCT 60/45/4098 42.2  39.0 - 52.0 % Final   MCV 11/19/2022 99.3  80.0 - 100.0 fL Final   MCH 11/19/2022 35.5 (H)  26.0 - 34.0 pg Final   MCHC 11/19/2022 35.8  30.0 - 36.0 g/dL Final   RDW 11/91/4782 14.6  11.5 - 15.5 % Final   Platelets 11/19/2022 262  150 - 400 K/uL Final   nRBC 11/19/2022 0.0  0.0 - 0.2 % Final   Neutrophils Relative % 11/19/2022 70  % Final   Neutro Abs 11/19/2022 4.9  1.7 - 7.7 K/uL Final   Lymphocytes Relative 11/19/2022 23  % Final   Lymphs Abs 11/19/2022 1.6  0.7 - 4.0 K/uL Final   Monocytes Relative 11/19/2022 6  % Final   Monocytes Absolute 11/19/2022 0.4  0.1 - 1.0 K/uL Final   Eosinophils Relative 11/19/2022 0  % Final   Eosinophils Absolute 11/19/2022 0.0  0.0 - 0.5 K/uL Final   Basophils Relative 11/19/2022 1  % Final   Basophils Absolute 11/19/2022 0.1  0.0 - 0.1 K/uL Final   Immature Granulocytes 11/19/2022 0  % Final   Abs Immature Granulocytes 11/19/2022 0.02  0.00 - 0.07 K/uL Final   Performed at [facility], [address] [place], [place] [address]    Sodium 11/19/2022 134 (L)  135 - 145 mmol/L Final   Potassium 11/19/2022 4.0  3.5 - 5.1  mmol/L Final   Chloride 11/19/2022 94 (L)  98 - 111 mmol/L Final   CO2 11/19/2022 20 (L)  22 - 32 mmol/L Final   Glucose, Bld 11/19/2022 187 (H)  70 - 99 mg/dL Final   Glucose reference range applies only to samples taken after fasting for at least 8 hours.   BUN 11/19/2022 <5 (L)  6 - 20 mg/dL Final   Creatinine, Ser 11/19/2022 0.78  0.61 - 1.24 mg/dL Final   Calcium 16/05/9603 9.3  8.9 - 10.3 mg/dL Final   Total Protein 54/04/8118 7.8  6.5 - 8.1 g/dL Final   Albumin 14/78/2956 3.8  3.5 - 5.0 g/dL Final   AST 21/30/8657 179 (H)  15 - 41 U/L Final   ALT 11/19/2022 137 (H)  0 - 44 U/L Final   Alkaline Phosphatase 11/19/2022 164 (H)  38 - 126 U/L Final   Total Bilirubin 11/19/2022 0.9  0.3 - 1.2 mg/dL Final   GFR, Estimated 11/19/2022 >60  >60 mL/min Final   Comment: (NOTE) Calculated using the CKD-EPI Creatinine Equation (2021)    Anion gap 11/19/2022 20 (H)  5 - 15 Final   Performed at [facility], [address] [place], [place] [address]   Hgb A1c MFr Bld 11/19/2022 7.8 (H)  4.8 - 5.6 % Final   Comment: (NOTE) Pre diabetes:          5.7%-6.4%  Diabetes:              >6.4%  Glycemic control for   <7.0% adults with diabetes    Mean Plasma Glucose 11/19/2022 177.16  mg/dL Final   Performed at [facility], [address], [place], [place] [address]   Alcohol, Ethyl (B) 11/19/2022 152 (H)  <10 mg/dL Final   Comment: (NOTE) Lowest detectable limit for serum alcohol is 10 mg/dL.  For medical purposes only. Performed at [facility], [address] [place], [place] [address]    TSH 11/19/2022 2.337  0.350 - 4.500 uIU/mL Final   Comment: Performed by a 3rd Generation assay with a functional sensitivity of <=0.01 uIU/mL. Performed at [facility], [address], [place], [place] [address]    POC Amphetamine UR 11/20/2022 None Detected  NONE DETECTED (Cut Off Level  1000 ng/mL) Final   POC Secobarbital (BAR) 11/20/2022 None Detected  NONE DETECTED (Cut Off Level 300 ng/mL) Final   POC Buprenorphine (BUP) 11/20/2022 None Detected  NONE DETECTED (Cut Off Level 10 ng/mL) Final   POC Oxazepam (BZO) 11/20/2022 None Detected  NONE DETECTED (Cut Off Level 300 ng/mL) Final   POC Cocaine UR 11/20/2022 None Detected  NONE DETECTED (Cut Off Level 300 ng/mL) Final   POC Methamphetamine UR 11/20/2022 None Detected  NONE DETECTED (Cut Off Level 1000 ng/mL) Final   POC Morphine 11/20/2022 None Detected  NONE DETECTED (Cut Off Level 300 ng/mL) Final   POC Methadone UR 11/20/2022 None Detected  NONE DETECTED (Cut Off Level 300 ng/mL) Final   POC Oxycodone UR 11/20/2022 None Detected  NONE DETECTED (Cut Off Level 100 ng/mL) Final   POC Marijuana UR 11/20/2022 None Detected  NONE DETECTED (Cut Off Level 50 ng/mL) Final   Cholesterol 11/19/2022 231 (H)  0 - 200 mg/dL Final   Triglycerides 72/53/6644 212 (H)  <150 mg/dL Final   HDL 03/47/4259 37 (L)  >40 mg/dL Final   Total CHOL/HDL Ratio 11/19/2022 6.2  RATIO Final   VLDL 11/19/2022 42 (H)  0 -  40 mg/dL Final   LDL Cholesterol 11/19/2022 152 (H)  0 - 99 mg/dL Final   Comment:        Total Cholesterol/HDL:CHD Risk Coronary Heart Disease Risk Table                     Men   Women  1/2 Average Risk   3.4   3.3  Average Risk       5.0   4.4  2 X Average Risk   9.6   7.1  3 X Average Risk  23.4   11.0        Use the calculated Patient Ratio above and the CHD Risk Table to determine the patient's CHD Risk.        ATP III CLASSIFICATION (LDL):  <100     mg/dL   Optimal  629-528  mg/dL   Near or Above                    Optimal  130-159  mg/dL   Borderline  413-244  mg/dL   High  >010     mg/dL   Very High Performed at [facility], [address] [place], [place] [address]     Blood Alcohol level:  Lab Results  Component Value Date   ETH 152 (H) 11/19/2022    Metabolic Disorder Labs: Lab Results   Component Value Date   HGBA1C 7.8 (H) 11/19/2022   MPG 177.16 11/19/2022   No results found for: "PROLACTIN" Lab Results  Component Value Date   CHOL 231 (H) 11/19/2022   TRIG 212 (H) 11/19/2022   HDL 37 (L) 11/19/2022   CHOLHDL 6.2 11/19/2022   VLDL 42 (H) 11/19/2022   LDLCALC 152 (H) 11/19/2022    Therapeutic Lab Levels: No results found for: "LITHIUM" No results found for: "VALPROATE" No results found for: "CBMZ"  Physical Findings   PHQ2-9    Flowsheet Row ED from 11/19/2022 in [facility]  PHQ-2 Total Score 1      Flowsheet Row ED from 11/19/2022 in [facility]  C-SSRS RISK CATEGORY Low Risk        Musculoskeletal  Strength & Muscle Tone: within normal limits Gait & Station: normal Patient leans: N/A  Psychiatric Specialty Exam  Presentation  General Appearance:  Appropriate for Environment; Casual  Eye Contact: Fair  Speech: Clear and Coherent; Normal Rate  Speech Volume: Normal  Handedness: Right   Mood and Affect  Mood: Euthymic  Affect: Blunt   Thought Process  Thought Processes: Coherent; Goal Directed; Linear  Descriptions of Associations:Intact  Orientation:Full (Time, Place and Person)  Thought Content:Logical  Diagnosis of Schizophrenia or Schizoaffective disorder in past: No    Hallucinations:Hallucinations: None  Ideas of Reference:None  Suicidal Thoughts:Suicidal Thoughts: No  Homicidal Thoughts:Homicidal Thoughts: No   Sensorium  Memory: Immediate Fair  Judgment: Intact  Insight: Present   Executive Functions  Concentration: Fair  Attention Span: Fair  Recall: [employer] of Knowledge: Fair  Language: Fair   Psychomotor Activity  Psychomotor Activity: Psychomotor Activity: Normal   Assets  Assets: Communication Skills; Desire for Improvement; Financial Resources/Insurance; Housing; Intimacy; Resilience; Social  Support   Sleep  Sleep: Sleep: Fair   Nutritional Assessment (For OBS and FBC admissions only) Has the patient had a weight loss or gain of 10 pounds or more in the last 3 months?: No Has the patient had a decrease in  food intake/or appetite?: No Does the patient have dental problems?: No Does the patient have eating habits or behaviors that may be indicators of an eating disorder including binging or inducing vomiting?: No Has the patient recently lost weight without trying?: 0 Has the patient been eating poorly because of a decreased appetite?: 0 Malnutrition Screening Tool Score: 0    Physical Exam  Physical Exam Constitutional:      Appearance: Normal appearance.  Pulmonary:     Effort: Pulmonary effort is normal.  Neurological:     Mental Status: He is alert and oriented to person, place, and time.    Review of Systems  Psychiatric/Behavioral:  Negative for hallucinations and suicidal ideas. The patient does not have insomnia.    Blood pressure (!) 140/107, pulse 86, temperature 98.2 F (36.8 C), resp. rate 19, SpO2 98 %. There is no height or weight on file to calculate BMI.  Treatment Plan Summary: Daily contact with patient to assess and evaluate symptoms and progress in treatment and Medication management   Based on assessment patient appears to be doing fairly well, but he is only about 24hrs into detox. He endorses a hx of not withdrawal, but will continue to monitor. With his FALD, will reassess Liver enzymes and DO NOT recommend NALTREXONE unless they significantly decrease.  Will continue Ativan CIWA PRN protocol. Patient also appears to be a new diabetic will consult to DM coordinator. IN regards to SA, patient has been severely intoxicated for sometimes and this Etoh intake could have led to further depression, however will continue to monitor for necessity for anti-depressant.     Etoh use d/o, severe Sedative- hypnotic abuse (Ambien) - Ambien decreased  by admitting provider from  QHS to  QHS PRN, will continue - Start gabapentin  TID PRN - CIWA w/ ATIVAN PRN -Atarax  TID, anxiety -Trazodone  QHS, insomnia  - Zofran  q6h , nausea -Naproxen  q8h PRN, pain -loperamide 2-4mg , diarrhea   HLD - Restart home atorvastatin  daily  T2DM - Referral to DM coordinator   FALD - Repeat LFTs Monday AM    PGY-3 Bobbye Morton, MD 11/20/2022 2:04 PM

## 2022-11-20 NOTE — ED Notes (Signed)
Pt sleeping in no acute distress. RR even and unlabored. Environment secured. Will continue to monitor for safety. 

## 2022-11-20 NOTE — ED Notes (Signed)
Patient was transferred from continuous assessment to [facility]. Patient completed [facility] admission paperwork. Patient does not have anything in his locker. Patient has shoes and two books with him that his wife brought into the facility for him.  Patient was oriented to the unit. Patient has been calm and cooperative. Patient denies SI/HI and AVH. Patient is being monitored for safety.

## 2022-11-20 NOTE — ED Notes (Signed)
Pt is in his room resting in bed. Pt stated he felt lightheaded when he got up from bed and writer discussed getting out of bed slowly  whenever he has to  to avoid lightheaded and orthostatic hypotension.Pt denies SI/HI/AVH. No acute distress noted. Will continue to monitor for safety.

## 2022-11-20 NOTE — ED Notes (Signed)
Pt is currently sleeping, no distress noted, environmental check complete, will continue to monitor patient for safety.  

## 2022-11-20 NOTE — Progress Notes (Signed)
Logan Martinez transferred to [facility] with the appropriate paper work.

## 2022-11-20 NOTE — Progress Notes (Signed)
Patient has been denied by [facility] due to no appropriate beds available. Patient meets BH inpatient criteria per Vernard Gambles, NP. Patient has been faxed out to the following facilities:   [facility]  [address], [place] [address] [phone]  [facility]  [address] [place] [address] [phone]  [facility]  [address], [place] [address] [phone]  [facility]  [address] [place] [place] [address] [phone]  [facility]  [address] [place] [address] [phone]  [facility]  [address], [place] [address] [phone]  [facility]  [address], [place] [address] [phone]  [facility]  [address] [place], [place] [address] [phone]  [facility]  [address] [place], [place] [address] [phone]  [facility]  [address] [place] [address] [phone]  [facility]  [address] [place] [address] [phone]  [facility]  [address] [place] [address] [phone]   Damita Dunnings, MSW, LCSW-A  10:19 AM 11/20/2022

## 2022-11-20 NOTE — ED Provider Notes (Signed)
Facility Based Crisis Admission H&P  Date: 11/20/22 Patient Name: Logan Martinez MRN: [id number] Chief Complaint: "feeling sleepy, fine"  Diagnoses:  Final diagnoses:  Alcohol abuse  Suicidal ideations   HPI:   Pt is a 57 y/o male w/ history of alcohol abuse, hypercholesteremia, presenting to [facility] on 11/19/22 with his wife for suicidal ideations, had earlier in the day drank alcohol and taken 2 ambien.   On reassessment today, pt denies SI/VI/HI, AVH, paranoia. Denies AVH, paranoia. He feels his alcohol use is a problem, is affecting his ability to maintain a job, his relationship with his wife. He reports he was drinking prior to taking the 2 ambien yesterday and he did not do this as a suicide attempt but to go to sleep with the intention of waking up again. He does not feel he needs an inpatient psychiatric admission although would benefit from substance use treatment. Discussed with pt transfer to [facility] which he is in agreement with. Discussed concerns regarding ambien abuse. Pt is currently prescribed ambien 10mg . He is receptive to having ambien decreased to 5mg . Discussed will also add trazodone 50mg  prn for sleep. Pt verbalized understanding. He denies any current withdrawal symptoms. He gives verbal consent to speak with his wife to provide updates.  Spoke w/ Nora, [phone]. Discussed transfer to [facility] for substance use treatment. [name] is in agreement. Discussed will be decreasing ambien from 10mg  to 5mg  and to add trazodone 50mg  prn for sleep. Kristen verbalized understanding. [name] expresses concerns regarding ambien abuse. She states pt is given 90 day supply of ambien at a time, last picked up in February, and has only 5 pills left.   PHQ 2-9:   Flowsheet Row ED from 11/19/2022 in [facility]  C-SSRS RISK CATEGORY Low Risk        Total Time spent with patient: 45 minutes  Musculoskeletal  Strength & Muscle Tone: within normal  limits Gait & Station: normal Patient leans: N/A  Psychiatric Specialty Exam  Presentation General Appearance:  Appropriate for Environment; Casual  Eye Contact: Fair  Speech: Clear and Coherent; Normal Rate  Speech Volume: Normal  Handedness: Right   Mood and Affect  Mood: Euthymic  Affect: Blunt   Thought Process  Thought Processes: Coherent; Goal Directed; Linear  Descriptions of Associations:Intact  Orientation:Full (Time, Place and Person)  Thought Content:Logical  Diagnosis of Schizophrenia or Schizoaffective disorder in past: No   Hallucinations:Hallucinations: None  Ideas of Reference:None  Suicidal Thoughts:Suicidal Thoughts: No  Homicidal Thoughts:Homicidal Thoughts: No   Sensorium  Memory: Immediate Fair  Judgment: Intact  Insight: Present   Executive Functions  Concentration: Fair  Attention Span: Fair  Recall: [employer] of Knowledge: Fair  Language: Fair   Psychomotor Activity  Psychomotor Activity: Psychomotor Activity: Normal   Assets  Assets: Communication Skills; Desire for Improvement; Financial Resources/Insurance; Housing; Intimacy; Resilience; Social Support   Sleep  Sleep: Sleep: Fair   Nutritional Assessment (For OBS and [facility] admissions only) Has the patient had a weight loss or gain of 10 pounds or more in the last 3 months?: No Has the patient had a decrease in food intake/or appetite?: No Does the patient have dental problems?: No Does the patient have eating habits or behaviors that may be indicators of an eating disorder including binging or inducing vomiting?: No Has the patient recently lost weight without trying?: 0 Has the patient been eating poorly because of a decreased appetite?: 0 Malnutrition Screening Tool Score: 0  Physical Exam Constitutional:      General: He is not in acute distress.    Appearance: He is not ill-appearing, toxic-appearing or diaphoretic.  Eyes:      General: No scleral icterus. Cardiovascular:     Rate and Rhythm: Normal rate.  Pulmonary:     Effort: Pulmonary effort is normal. No respiratory distress.  Neurological:     Mental Status: He is alert and oriented to person, place, and time.  Psychiatric:        Attention and Perception: Attention and perception normal.        Mood and Affect: Mood normal. Affect is blunt.        Speech: Speech normal.        Behavior: Behavior normal. Behavior is cooperative.        Thought Content: Thought content normal.        Cognition and Memory: Cognition and memory normal.        Judgment: Judgment normal.    Review of Systems  Constitutional:  Negative for chills and fever.  Respiratory:  Negative for shortness of breath.   Cardiovascular:  Negative for chest pain and palpitations.  Gastrointestinal:  Negative for abdominal pain.  Neurological:  Negative for headaches.  Psychiatric/Behavioral:  Positive for substance abuse.     Blood pressure (!) 140/107, pulse 86, temperature 98.2 F (36.8 C), resp. rate 19, SpO2 98 %. There is no height or weight on file to calculate BMI.  Past Psychiatric History: Alcohol abuse   Is the patient at risk to self? No  Has the patient been a risk to self in the past 6 months? No .    Has the patient been a risk to self within the distant past? No   Is the patient a risk to others? No   Has the patient been a risk to others in the past 6 months? No   Has the patient been a risk to others within the distant past? No   Past Medical History: Hypercholesteremia Family History: None reported Social History: Unemployed, living with wife  Last Labs:  Admission on 11/19/2022  Component Date Value Ref Range Status   SARS Coronavirus 2 by RT PCR 11/19/2022 NEGATIVE  NEGATIVE Final   Performed at [facility], [address], [place], [place] [address]   WBC 11/19/2022 7.0  4.0 - 10.5 K/uL Final   RBC 11/19/2022 4.25  4.22 - 5.81 MIL/uL Final    Hemoglobin 11/19/2022 15.1  13.0 - 17.0 g/dL Final   HCT 60/45/4098 42.2  39.0 - 52.0 % Final   MCV 11/19/2022 99.3  80.0 - 100.0 fL Final   MCH 11/19/2022 35.5 (H)  26.0 - 34.0 pg Final   MCHC 11/19/2022 35.8  30.0 - 36.0 g/dL Final   RDW 11/91/4782 14.6  11.5 - 15.5 % Final   Platelets 11/19/2022 262  150 - 400 K/uL Final   nRBC 11/19/2022 0.0  0.0 - 0.2 % Final   Neutrophils Relative % 11/19/2022 70  % Final   Neutro Abs 11/19/2022 4.9  1.7 - 7.7 K/uL Final   Lymphocytes Relative 11/19/2022 23  % Final   Lymphs Abs 11/19/2022 1.6  0.7 - 4.0 K/uL Final   Monocytes Relative 11/19/2022 6  % Final   Monocytes Absolute 11/19/2022 0.4  0.1 - 1.0 K/uL Final   Eosinophils Relative 11/19/2022 0  % Final   Eosinophils Absolute 11/19/2022 0.0  0.0 - 0.5 K/uL Final   Basophils  Relative 11/19/2022 1  % Final   Basophils Absolute 11/19/2022 0.1  0.0 - 0.1 K/uL Final   Immature Granulocytes 11/19/2022 0  % Final   Abs Immature Granulocytes 11/19/2022 0.02  0.00 - 0.07 K/uL Final   Performed at [facility], [address], [place], [place] [address]   Sodium 11/19/2022 134 (L)  135 - 145 mmol/L Final   Potassium 11/19/2022 4.0  3.5 - 5.1 mmol/L Final   Chloride 11/19/2022 94 (L)  98 - 111 mmol/L Final   CO2 11/19/2022 20 (L)  22 - 32 mmol/L Final   Glucose, Bld 11/19/2022 187 (H)  70 - 99 mg/dL Final   Glucose reference range applies only to samples taken after fasting for at least 8 hours.   BUN 11/19/2022 <5 (L)  6 - 20 mg/dL Final   Creatinine, Ser 11/19/2022 0.78  0.61 - 1.24 mg/dL Final   Calcium 60/45/4098 9.3  8.9 - 10.3 mg/dL Final   Total Protein 11/91/4782 7.8  6.5 - 8.1 g/dL Final   Albumin 95/62/1308 3.8  3.5 - 5.0 g/dL Final   AST 65/78/4696 179 (H)  15 - 41 U/L Final   ALT 11/19/2022 137 (H)  0 - 44 U/L Final   Alkaline Phosphatase 11/19/2022 164 (H)  38 - 126 U/L Final   Total Bilirubin 11/19/2022 0.9  0.3 - 1.2 mg/dL Final   GFR, Estimated 11/19/2022 >60  >60 mL/min  Final   Comment: (NOTE) Calculated using the CKD-EPI Creatinine Equation (2021)    Anion gap 11/19/2022 20 (H)  5 - 15 Final   Performed at [facility], [address], [place], [place] [address]   Hgb A1c MFr Bld 11/19/2022 7.8 (H)  4.8 - 5.6 % Final   Comment: (NOTE) Pre diabetes:          5.7%-6.4%  Diabetes:              >6.4%  Glycemic control for   <7.0% adults with diabetes    Mean Plasma Glucose 11/19/2022 177.16  mg/dL Final   Performed at [facility], [address], [place], [place] [address]   Alcohol, Ethyl (B) 11/19/2022 152 (H)  <10 mg/dL Final   Comment: (NOTE) Lowest detectable limit for serum alcohol is 10 mg/dL.  For medical purposes only. Performed at [facility], [address] [place], [place] [address]    TSH 11/19/2022 2.337  0.350 - 4.500 uIU/mL Final   Comment: Performed by a 3rd Generation assay with a functional sensitivity of <=0.01 uIU/mL. Performed at [facility], [address], [place], [place] [address]    Cholesterol 11/19/2022 231 (H)  0 - 200 mg/dL Final   Triglycerides 64/40/3474 212 (H)  <150 mg/dL Final   HDL 25/95/6387 37 (L)  >40 mg/dL Final   Total CHOL/HDL Ratio 11/19/2022 6.2  RATIO Final   VLDL 11/19/2022 42 (H)  0 - 40 mg/dL Final   LDL Cholesterol 11/19/2022 152 (H)  0 - 99 mg/dL Final   Comment:        Total Cholesterol/HDL:CHD Risk Coronary Heart Disease Risk Table                     Men   Women  1/2 Average Risk   3.4   3.3  Average Risk       5.0   4.4  2 X Average Risk   9.6   7.1  3 X Average Risk  23.4   11.0        Use the calculated Patient Ratio above and the CHD Risk Table to determine the patient's CHD Risk.        ATP III CLASSIFICATION (LDL):  <100     mg/dL   Optimal  161-096  mg/dL   Near or Above                    Optimal  130-159  mg/dL   Borderline  045-409  mg/dL   High  >811     mg/dL   Very High Performed at [facility], [address] [place],  [place] [address]     Allergies: Patient has no known allergies.  Medications:  Facility Ordered Medications  Medication   acetaminophen (TYLENOL) tablet 650 mg   [COMPLETED] thiamine (VITAMIN B1) injection 100 mg   thiamine (VITAMIN B1) tablet 100 mg   multivitamin with minerals tablet 1 tablet   LORazepam (ATIVAN) tablet 1 mg   hydrOXYzine (ATARAX) tablet 25 mg   loperamide (IMODIUM) capsule 2-4 mg   ondansetron (ZOFRAN-ODT) disintegrating tablet 4 mg   gabapentin (NEURONTIN) capsule 300 mg   metoprolol succinate (TOPROL-XL) 24 hr tablet 50 mg   zolpidem (AMBIEN) tablet 5 mg   traZODone (DESYREL) tablet 50 mg   PTA Medications  Medication Sig   gabapentin (NEURONTIN) 300 MG capsule Take 300 mg by mouth 4 (four) times daily.   metoprolol succinate (TOPROL-XL) 50 MG 24 hr tablet Take 50 mg by mouth daily.   atorvastatin (LIPITOR) 40 MG tablet Take 40 mg by mouth daily.   zolpidem (AMBIEN) 10 MG tablet Take 10 mg by mouth at bedtime.    Long Term Goals: Improvement in symptoms so as ready for discharge  Short Term Goals: Patient will verbalize feelings in meetings with treatment team members., Patient will attend at least of 50% of the groups daily., Pt will complete the PHQ9 on admission, day 3 and discharge., and Patient will take medications as prescribed daily.  Medical Decision Making  Pt is a 57 y/o male w/ history of alcohol abuse, hypercholesteremia, presenting to [facility] on 11/19/22 with his wife for suicidal ideations, had earlier in the day drank alcohol and taken 2 ambien. Pt initially recommended for inpatient psychiatric admission. On reevaluation, is appropriate for [facility]. Will transfer to [facility] today.  Continue current medications, will decrease ambien from 10mg  to 5mg  and add trazodone 50mg  prn sleep.  Recommendations  Based on my evaluation the patient does not appear to have an emergency medical condition.  Lauree Chandler, NP 11/20/22  10:39 AM

## 2022-11-20 NOTE — Progress Notes (Signed)
Received Logan Martinez this AM asleep in his chair bed, he was awaken by the provider for assessment and returned to his bed. Later he was medicated per order. He denied all of the psychiatric symptoms this AM. He is presently resting quietly in his bed.

## 2022-11-20 NOTE — ED Notes (Signed)
Patient was offered lunch, but declined 

## 2022-11-20 NOTE — ED Notes (Signed)
Patient is sleeping. Respirations equal and unlabored, skin warm and dry. No change in assessment or acuity. Routine safety checks conducted according to facility protocol. Will continue to monitor for safety.   

## 2022-11-20 NOTE — ED Notes (Signed)
Patient was provided with dinner 

## 2022-11-20 NOTE — Group Note (Signed)
Group Topic: Decisional Balance/Substance Abuse  Group Date: 11/20/2022 Start Time: 1515 End Time: 1610 Facilitators: Prentice Docker, RN  Department: [facility]  Number of Participants: 7  Group Focus: clarity of thought, daily focus, family, feeling awareness/expression, goals/reality orientation, personal responsibility, and self-awareness Treatment Modality:  Psychoeducation Interventions utilized were clarification and problem solving Purpose: enhance coping skills, improve communication skills, increase insight, regain self-worth, and relapse prevention strategies  Name: Logan Martinez Date of Birth: 06-18-1966  MR: [id number]    Level of Participation: active Quality of Participation: attentive, cooperative, engaged, and offered feedback Interactions with others: gave feedback Mood/Affect: appropriate and positive Triggers (if applicable): n/a Cognition: coherent/clear and logical Progress: Gaining insight Response: provided insight on reasons to remain "clean" Plan: patient will be encouraged to attend future groups  Patients Problems:  Patient Active Problem List   Diagnosis Date Noted   Alcohol abuse 11/20/2022

## 2022-11-21 DIAGNOSIS — F329 Major depressive disorder, single episode, unspecified: Secondary | ICD-10-CM | POA: Diagnosis not present

## 2022-11-21 DIAGNOSIS — E7800 Pure hypercholesterolemia, unspecified: Secondary | ICD-10-CM | POA: Diagnosis not present

## 2022-11-21 DIAGNOSIS — R45851 Suicidal ideations: Secondary | ICD-10-CM | POA: Diagnosis not present

## 2022-11-21 DIAGNOSIS — F1010 Alcohol abuse, uncomplicated: Secondary | ICD-10-CM | POA: Diagnosis not present

## 2022-11-21 DIAGNOSIS — Z1152 Encounter for screening for COVID-19: Secondary | ICD-10-CM | POA: Diagnosis not present

## 2022-11-21 LAB — COMPREHENSIVE METABOLIC PANEL
ALT: 89 U/L — ABNORMAL HIGH (ref 0–44)
AST: 116 U/L — ABNORMAL HIGH (ref 15–41)
Albumin: 3.5 g/dL (ref 3.5–5.0)
Alkaline Phosphatase: 139 U/L — ABNORMAL HIGH (ref 38–126)
Anion gap: 10 (ref 5–15)
BUN: 5 mg/dL — ABNORMAL LOW (ref 6–20)
CO2: 29 mmol/L (ref 22–32)
Calcium: 9.1 mg/dL (ref 8.9–10.3)
Chloride: 94 mmol/L — ABNORMAL LOW (ref 98–111)
Creatinine, Ser: 0.9 mg/dL (ref 0.61–1.24)
GFR, Estimated: >60 mL/min (ref >60)
Glucose, Bld: 169 mg/dL — ABNORMAL HIGH (ref 70–99)
Potassium: 4.6 mmol/L (ref 3.5–5.1)
Sodium: 133 mmol/L — ABNORMAL LOW (ref 135–145)
Total Bilirubin: 1.8 mg/dL — ABNORMAL HIGH (ref 0.3–1.2)
Total Protein: 7 g/dL (ref 6.5–8.1)

## 2022-11-21 MED ORDER — SERTRALINE HCL 25 MG PO TABS
25.0000 mg | ORAL_TABLET | Freq: Every day | ORAL | Status: DC
  Administered 2022-11-21 – 2022-11-22 (×2): 25 mg via ORAL
  Filled 2022-11-21 (×2): qty 1

## 2022-11-21 NOTE — ED Notes (Signed)
Patient is sleeping. Respirations equal and unlabored, skin warm and dry. No change in assessment or acuity. Routine safety checks conducted according to facility protocol. Will continue to monitor for safety.   

## 2022-11-21 NOTE — ED Provider Notes (Signed)
Behavioral Health Progress Note  Date and Time: 11/21/2022 3:54 PM Name: Logan Martinez MRN:  [id number]  Subjective:  Pt is a 57 y/o male w/ history of alcohol abuse, hypercholesteremia, presenting to [facility] on 11/19/22 with his wife for suicidal ideations, had earlier in the day drank alcohol and taken 2 ambien.   On assessment today, patient continues to endorse depressive symptoms. He denies any hx of withdrawal and reiterates he was able to maintain sobriety on his own for 2 weeks. We discuss his stressors extensively today and discuss managing his withdrawal upon discharge. He declines residential substance use treatment but is open to CDIOP. Currently he is denying SI, HI, and AVH. We discuss starting  an SSRI, and patient is agreeable.  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 Prescriptions: ambein, metoprolol Over the Counter: none History of alcohol / drug use?: Yes Longest period of sobriety (when/how long): 5-6 days Negative Consequences of Use: Legal (Pt  was charged with DUI last year. Had license suspended, paid a fine and attend classes.) Withdrawal Symptoms: None Name of Substance 1: Alcohol 1 - Age of First Use: [age] 1 - Amount (size/oz): 1-2 1 - Frequency: daily 1 - Duration: 3 years 1 - Last Use / Amount: this morning/quart of wine 1 - Method of Aquiring: pruchase 1- Route of Use: drinking                  Sleep: Good  Appetite:  Poor, improving  Current Medications:   Current Facility-Administered Medications  Medication Dose Route Frequency Provider Last Rate Last Admin   atorvastatin (LIPITOR) tablet 40 mg  40 mg Oral Daily Eliseo Gum B, MD   40 mg at 11/21/22 0912   gabapentin (NEURONTIN) capsule 300 mg  300 mg Oral TID Lauree Chandler, NP   300 mg at 11/21/22 1509   hydrOXYzine (ATARAX) tablet 25 mg  25 mg Oral Q6H PRN Lauree Chandler, NP   25 mg at 11/19/22 2110   loperamide (IMODIUM) capsule 2-4 mg  2-4 mg Oral PRN Lauree Chandler, NP       LORazepam (ATIVAN) tablet 1 mg  1 mg Oral Q6H PRN Lauree Chandler, NP       metoprolol succinate (TOPROL-XL) 24 hr tablet 50 mg  50 mg Oral Daily Lauree Chandler, NP   50 mg at 11/21/22 0912   multivitamin with minerals tablet 1 tablet  1 tablet Oral Daily Lauree Chandler, NP   1 tablet at 11/21/22 0912   naproxen (NAPROSYN) tablet 500 mg  500 mg Oral Q8H PRN Eliseo Gum B, MD   500 mg at 11/21/22 1510   ondansetron (ZOFRAN-ODT) disintegrating tablet 4 mg  4 mg Oral Q6H PRN Lauree Chandler, NP   4 mg at 11/21/22 0912   thiamine (VITAMIN B1) tablet 100 mg  100 mg Oral Daily Lauree Chandler, NP   100 mg at 11/21/22 0912   traZODone (DESYREL)  tablet 50 mg  50 mg Oral QHS PRN Lauree Chandler, NP       zolpidem [facility]) tablet 5 mg  5 mg Oral QHS PRN Lauree Chandler, NP       Current Outpatient Medications  Medication Sig Dispense Refill   gabapentin (NEURONTIN) 300 MG capsule Take 300 mg by mouth 4 (four) times daily.     metoprolol succinate (TOPROL-XL) 50 MG 24 hr tablet Take 50 mg by mouth daily.     atorvastatin (LIPITOR) 40 MG tablet Take 40 mg by mouth daily.     zolpidem (AMBIEN) 10 MG tablet Take 10 mg by mouth at bedtime.      Labs  Lab Results:  Admission on 11/19/2022  Component Date Value Ref Range Status   SARS Coronavirus 2 by RT PCR 11/19/2022 NEGATIVE  NEGATIVE Final   Performed at [facility], [address] [place], [place] [address]   WBC  11/19/2022 7.0  4.0 - 10.5 K/uL Final   RBC 11/19/2022 4.25  4.22 - 5.81 MIL/uL Final   Hemoglobin 11/19/2022 15.1  13.0 - 17.0 g/dL Final   HCT 60/45/4098 42.2  39.0 - 52.0 % Final   MCV 11/19/2022 99.3  80.0 - 100.0 fL Final   MCH 11/19/2022 35.5 (H)  26.0 - 34.0 pg Final   MCHC 11/19/2022 35.8  30.0 - 36.0 g/dL Final   RDW 11/91/4782 14.6  11.5 - 15.5 % Final   Platelets 11/19/2022 262  150 - 400 K/uL Final   nRBC 11/19/2022 0.0  0.0 - 0.2 % Final   Neutrophils Relative % 11/19/2022 70  % Final   Neutro Abs 11/19/2022 4.9  1.7 - 7.7 K/uL Final   Lymphocytes Relative 11/19/2022 23  % Final   Lymphs Abs 11/19/2022 1.6  0.7 - 4.0 K/uL Final   Monocytes Relative 11/19/2022 6  % Final   Monocytes Absolute 11/19/2022 0.4  0.1 - 1.0 K/uL Final   Eosinophils Relative 11/19/2022 0  % Final   Eosinophils Absolute 11/19/2022 0.0  0.0 - 0.5 K/uL Final   Basophils Relative 11/19/2022 1  % Final   Basophils Absolute 11/19/2022 0.1  0.0 - 0.1 K/uL Final   Immature Granulocytes 11/19/2022 0  % Final   Abs Immature Granulocytes 11/19/2022 0.02  0.00 - 0.07 K/uL Final   Performed at [facility], [address] [place], [place] [address]   Sodium 11/19/2022 134 (L)  135 - 145 mmol/L Final   Potassium 11/19/2022 4.0  3.5 - 5.1 mmol/L Final   Chloride 11/19/2022 94 (L)  98 - 111 mmol/L Final   CO2 11/19/2022 20 (L)  22 - 32 mmol/L Final   Glucose, Bld 11/19/2022 187 (H)  70 - 99 mg/dL Final   Glucose reference range applies only to samples taken after fasting for at least 8 hours.   BUN 11/19/2022 <5 (L)  6 - 20 mg/dL Final   Creatinine, Ser 11/19/2022 0.78  0.61 - 1.24 mg/dL Final   Calcium 30/86/5784 9.3  8.9 - 10.3 mg/dL Final   Total Protein 69/62/9528 7.8  6.5 - 8.1 g/dL Final   Albumin 41/32/4401 3.8  3.5 - 5.0 g/dL Final   AST 02/72/5366 179 (H)  15 - 41 U/L Final   ALT 11/19/2022 137 (H)  0 - 44 U/L Final   Alkaline Phosphatase 11/19/2022 164 (H)  38 - 126 U/L Final   Total  Bilirubin 11/19/2022 0.9  0.3 - 1.2 mg/dL  Final   GFR, Estimated 11/19/2022 >60  >60 mL/min Final   Comment: (NOTE) Calculated using the CKD-EPI Creatinine Equation (2021)    Anion gap 11/19/2022 20 (H)  5 - 15 Final   Performed at [facility], [address], [place], [place] [address]   Hgb A1c MFr Bld 11/19/2022 7.8 (H)  4.8 - 5.6 % Final   Comment: (NOTE) Pre diabetes:          5.7%-6.4%  Diabetes:              >6.4%  Glycemic control for   <7.0% adults with diabetes    Mean Plasma Glucose 11/19/2022 177.16  mg/dL Final   Performed at [facility], [address] [place], [place] [address]   Alcohol, Ethyl (B) 11/19/2022 152 (H)  <10 mg/dL Final   Comment: (NOTE) Lowest detectable limit for serum alcohol is 10 mg/dL.  For medical purposes only. Performed at [facility], [address] [place], [place] [address]    TSH 11/19/2022 2.337  0.350 - 4.500 uIU/mL Final   Comment: Performed by a 3rd Generation assay with a functional sensitivity of <=0.01 uIU/mL. Performed at [facility], [address] [place], [place] [address]    POC Amphetamine UR 11/20/2022 None Detected  NONE DETECTED (Cut Off Level 1000 ng/mL) Final   POC Secobarbital (BAR) 11/20/2022 None Detected  NONE DETECTED (Cut Off Level 300 ng/mL) Final   POC Buprenorphine (BUP) 11/20/2022 None Detected  NONE DETECTED (Cut Off Level 10 ng/mL) Final   POC Oxazepam (BZO) 11/20/2022 None Detected  NONE DETECTED (Cut Off Level 300 ng/mL) Final   POC Cocaine UR 11/20/2022 None Detected  NONE DETECTED (Cut Off Level 300 ng/mL) Final   POC Methamphetamine UR 11/20/2022 None Detected  NONE DETECTED (Cut Off Level 1000 ng/mL) Final   POC Morphine 11/20/2022 None Detected  NONE DETECTED (Cut Off Level 300 ng/mL) Final   POC Methadone UR 11/20/2022 None Detected  NONE DETECTED (Cut Off Level 300 ng/mL) Final   POC Oxycodone UR 11/20/2022 None Detected  NONE DETECTED (Cut Off Level 100 ng/mL) Final    POC Marijuana UR 11/20/2022 None Detected  NONE DETECTED (Cut Off Level 50 ng/mL) Final   Cholesterol 11/19/2022 231 (H)  0 - 200 mg/dL Final   Triglycerides 21/30/8657 212 (H)  <150 mg/dL Final   HDL 84/69/6295 37 (L)  >40 mg/dL Final   Total CHOL/HDL Ratio 11/19/2022 6.2  RATIO Final   VLDL 11/19/2022 42 (H)  0 - 40 mg/dL Final   LDL Cholesterol 11/19/2022 152 (H)  0 - 99 mg/dL Final   Comment:        Total Cholesterol/HDL:CHD Risk Coronary Heart Disease Risk Table                     Men   Women  1/2 Average Risk   3.4   3.3  Average Risk       5.0   4.4  2 X Average Risk   9.6   7.1  3 X Average Risk  23.4   11.0        Use the calculated Patient Ratio above and the CHD Risk Table to determine the patient's CHD Risk.        ATP III CLASSIFICATION (LDL):  <100     mg/dL   Optimal  284-132  mg/dL   Near or Above  Optimal  130-159  mg/dL   Borderline  119-147  mg/dL   High  >829     mg/dL   Very High Performed at [facility], [address] [place], [place] [address]    Sodium 11/21/2022 133 (L)  135 - 145 mmol/L Final   Potassium 11/21/2022 4.6  3.5 - 5.1 mmol/L Final   Chloride 11/21/2022 94 (L)  98 - 111 mmol/L Final   CO2 11/21/2022 29  22 - 32 mmol/L Final   Glucose, Bld 11/21/2022 169 (H)  70 - 99 mg/dL Final   Glucose reference range applies only to samples taken after fasting for at least 8 hours.   BUN 11/21/2022 5 (L)  6 - 20 mg/dL Final   Creatinine, Ser 11/21/2022 0.90  0.61 - 1.24 mg/dL Final   Calcium 08/65/7846 9.1  8.9 - 10.3 mg/dL Final   Total Protein 96/29/5284 7.0  6.5 - 8.1 g/dL Final   Albumin 13/24/4010 3.5  3.5 - 5.0 g/dL Final   AST 27/25/3664 116 (H)  15 - 41 U/L Final   ALT 11/21/2022 89 (H)  0 - 44 U/L Final   Alkaline Phosphatase 11/21/2022 139 (H)  38 - 126 U/L Final   Total Bilirubin 11/21/2022 1.8 (H)  0.3 - 1.2 mg/dL Final   GFR, Estimated 11/21/2022 >60  >60 mL/min Final   Comment: (NOTE) Calculated using  the CKD-EPI Creatinine Equation (2021)    Anion gap 11/21/2022 10  5 - 15 Final   Performed at [facility], [address], [place], [place] [address]    Blood Alcohol level:  Lab Results  Component Value Date   ETH 152 (H) 11/19/2022    Metabolic Disorder Labs: Lab Results  Component Value Date   HGBA1C 7.8 (H) 11/19/2022   MPG 177.16 11/19/2022   No results found for: "PROLACTIN" Lab Results  Component Value Date   CHOL 231 (H) 11/19/2022   TRIG 212 (H) 11/19/2022   HDL 37 (L) 11/19/2022   CHOLHDL 6.2 11/19/2022   VLDL 42 (H) 11/19/2022   LDLCALC 152 (H) 11/19/2022    Therapeutic Lab Levels: No results found for: "LITHIUM" No results found for: "VALPROATE" No results found for: "CBMZ"  Physical Findings   PHQ2-9    Flowsheet Row ED from 11/19/2022 in [facility]  PHQ-2 Total Score 1      Flowsheet Row ED from 11/19/2022 in [facility]  C-SSRS RISK CATEGORY Low Risk        Musculoskeletal  Strength & Muscle Tone: within normal limits Gait & Station: normal Patient leans: N/A  Psychiatric Specialty Exam  Presentation  General Appearance:  Appropriate for Environment; Casual  Eye Contact: Fair  Speech: Clear and Coherent; Normal Rate  Speech Volume: Normal  Handedness: Right   Mood and Affect  Mood: Euthymic  Affect: Blunt   Thought Process  Thought Processes: Coherent; Goal Directed; Linear  Descriptions of Associations:Intact  Orientation:Full (Time, Place and Person)  Thought Content:Logical  Diagnosis of Schizophrenia or Schizoaffective disorder in past: No    Hallucinations:Hallucinations: None  Ideas of Reference:None  Suicidal Thoughts:Suicidal Thoughts: No  Homicidal Thoughts:Homicidal Thoughts: No   Sensorium  Memory: Immediate Fair  Judgment: Intact  Insight: Present   Executive Functions  Concentration: Fair  Attention  Span: Fair  Recall: Fair  Fund of Knowledge: Fair  Language: Fair   Psychomotor Activity  Psychomotor Activity: Psychomotor Activity: Normal   Assets  Assets: Manufacturing systems engineer; Desire for Improvement; Financial Resources/Insurance; Housing; Intimacy; Resilience; Social Support   Sleep  Sleep: Sleep: Fair   No data recorded   Physical Exam  Physical Exam Constitutional:      Appearance: Normal appearance.  Pulmonary:     Effort: Pulmonary effort is normal.  Neurological:     Mental Status: He is alert and oriented to person, place, and time.   Review of Systems  Psychiatric/Behavioral:  Negative for hallucinations and suicidal ideas. The patient does not have insomnia.    Blood pressure (!) 115/90, pulse 100, temperature 98.2 F (36.8 C), temperature source Oral, resp. rate 18, SpO2 98 %. There is no height or weight on file to calculate BMI.  Treatment Plan Summary: Daily contact with patient to assess and evaluate symptoms and progress in treatment and Medication management   Based on assessment patient appears to be doing fairly well, but he is only about 24hrs into detox. He endorses a hx of not withdrawal, but will continue to monitor. With his FALD, will reassess Liver enzymes and DO NOT recommend NALTREXONE unless they significantly decrease.  Will continue Ativan CIWA PRN protocol. Patient also appears to be a new diabetic will consult to DM coordinator. IN regards to SA, patient has been severely intoxicated for sometimes and this Etoh intake could have led to further depression, however due to multiple losses and ongoing and medically unaddressed depressive symptoms patient agreeable to SSRI.  Etoh use d/o, severe Sedative- hypnotic abuse (Ambien) - Ambien decreased by admitting provider from  QHS to  QHS PRN, will continue - Continue gabapentin  TID PRN - CIWA w/ ATIVAN PRN -Atarax  TID, anxiety -Trazodone  QHS, insomnia  -  Zofran  q6h , nausea -Naproxen  q8h PRN, pain -loperamide 2-4mg , diarrhea  MDD - Start Zoloft 25 mg  HLD - Continue home atorvastatin  daily  T2DM - Referral to DM coordinator placed; awaiting guidance   FALD - Repeat LFTs today downtrending appropriately  Dispo: Pending; CDIOP  PGY-2 Lamar Sprinkles, MD 11/21/2022 3:54 PM

## 2022-11-21 NOTE — ED Notes (Signed)
Snacks given 

## 2022-11-21 NOTE — Group Note (Signed)
Group Topic: Relapse and Recovery  Group Date: 11/21/2022 Start Time: 1000 End Time: 1115 Facilitators: Cristal Ford  Department: [facility]  Number of Participants: 5  Group Focus: relapse prevention and safety plan Treatment Modality:  Psychoeducation Interventions utilized were group exercise and support Purpose: relapse prevention strategies  Name: Logan Martinez Date of Birth: 1965-09-15  MR: [id number]    Level of Participation: active Quality of Participation: attentive and cooperative Interactions with others: gave feedback Mood/Affect: positive Triggers (if applicable): na Cognition: coherent/clear Progress: Moderate Response: na Plan: follow-up needed  Patients Problems:  Patient Active Problem List   Diagnosis Date Noted   Alcohol abuse 11/20/2022

## 2022-11-21 NOTE — ED Notes (Signed)
Pt in dayroom watching tv with peers. Denies SI/HI/AVH. Denies s/s of withdrawal. Pt states he is doing much better, he is pleasant and engaged. No noted distress. Will continue to monitor for safety

## 2022-11-21 NOTE — ED Notes (Signed)
Patient is awake and alert on unit.  Reports feeling nauseous with dry heaves.  Zofran given and MD aware.  Patient encouraged to sip water and ice.  No other evidence of withdrawal.  Will monitor.

## 2022-11-22 DIAGNOSIS — F1010 Alcohol abuse, uncomplicated: Secondary | ICD-10-CM | POA: Diagnosis not present

## 2022-11-22 MED ORDER — LAMOTRIGINE 25 MG PO TABS
25.0000 mg | ORAL_TABLET | Freq: Every day | ORAL | Status: DC
  Administered 2022-11-22 – 2022-11-23 (×2): 25 mg via ORAL
  Filled 2022-11-22 (×2): qty 1

## 2022-11-22 NOTE — ED Notes (Signed)
Pt a/o . Watching tv in dayroom with peers. Denies SI/HI/AVH. Denies s/s of withdrawal. Pleasant, cooperative and engaged with staff. No noted distress. Will continue to monitor for safety

## 2022-11-22 NOTE — Group Note (Signed)
Group Topic: Communication  Group Date: 11/22/2022 Start Time: 1130 End Time: 1145 Facilitators: Emmit Pomfret D, NT  Department: [facility]  Number of Participants: 4  Group Focus: relaxation Treatment Modality:  Psychoeducation Interventions utilized were support Purpose: regain self-worth  Name: TRENDEN HAZELRIGG Date of Birth: 1965-12-24  MR: [id number]    Level of Participation: moderate Quality of Participation: attentive Interactions with others: gave feedback Mood/Affect: appropriate Triggers (if applicable): n/a Cognition: coherent/clear Progress: Moderate Response: n/a Plan: follow-up needed  Patients Problems:  Patient Active Problem List   Diagnosis Date Noted   Alcohol abuse 11/20/2022

## 2022-11-22 NOTE — ED Notes (Signed)
Pt observed/assessed in room sleeping. RR even and unlabored, appearing in no noted distress. Environmental check complete, will continue to monitor for safety 

## 2022-11-22 NOTE — Care Management (Signed)
[facility] Care Management   Writer coordinated a intake appointment with the [facility] CD-IOP program.  Patient has scheduled appointment on d Monday, 11/28/22 at 1 p.m. on the 2nd floor at the [facility]

## 2022-11-22 NOTE — ED Notes (Signed)
Pt sitting in dayroom interacting with peers. No acute distress noted. No concerns voiced. Informed pt to notify staff with any needs or assistance. Pt verbalized understanding or agreement. Will continue to monitor for safety. 

## 2022-11-22 NOTE — ED Notes (Signed)
Patient A&Ox4. Denies intent to harm self/others when asked. Denies A/VH. Patient denies any physical complaints when asked. No acute distress noted. Support and encouragement provided. Routine safety checks conducted according to facility protocol. Encouraged patient to notify staff if thoughts of harm toward self or others arise. Patient verbalize understanding and agreement. Will continue to monitor for safety.    

## 2022-11-22 NOTE — ED Provider Notes (Signed)
Behavioral Health Progress Note  Date and Time: 11/22/2022 4:02 PM Name: Logan Martinez MRN:  [id number]  Subjective:  Pt is a 57 y/o male w/ history of alcohol abuse, hypercholesteremia, presenting to [facility] on 11/19/22 with his wife for suicidal ideations and alcohol/BZD detox, for which he was admitted to [facility]  On assessment today, patient reports feeling restless and more energetic today after starting Zoloft yesterday. He did sleep well last night. During the assessment, he is fidgety and having difficulty sitting still. As well, he reports feeling more talkative today, but is unsure whether this could be attributed to boredom. He denies any withdrawal sx. He is advised that he was accepted to CDIOP here at the [facility] on Monday, 4/22, and he is appreciative. Currently he is denying SI, HI, and AVH. We discuss discontinuing the SSRI and starting Lamictal for likely mood activation, and he is agreeable. As long as patient tolerates the Lamictal well and shows no further mood activating symptoms tomorrow, he will be ready for discharge. He is in agreement.   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 Prescriptions: ambein, metoprolol Over the Counter: none History of alcohol / drug use?: Yes Longest period of sobriety (when/how long): 5-6 days Negative Consequences of Use: Legal (Pt  was charged with DUI last year. Had license suspended, paid a fine and attend classes.) Withdrawal Symptoms: None Name of Substance 1:  Alcohol 1 - Age of First Use: [age] 1 - Amount (size/oz): 1-2 1 - Frequency: daily 1 - Duration: 3 years 1 - Last Use / Amount: this morning/quart of wine 1 - Method of Aquiring: pruchase 1- Route of Use: drinking                  Sleep: Good  Appetite:  Fair, improving  Current Medications:  Current Facility-Administered Medications  Medication Dose Route Frequency Provider Last Rate Last Admin   atorvastatin (LIPITOR) tablet 40 mg  40 mg Oral Daily Eliseo Gum B, MD   40 mg at 11/22/22 0916   gabapentin (NEURONTIN) capsule 300 mg  300 mg Oral TID Lauree Chandler, NP   300 mg at 11/22/22 0916   lamoTRIgine (LAMICTAL) tablet 25 mg  25 mg Oral Daily Lamar Sprinkles, MD       metoprolol succinate (TOPROL-XL) 24 hr tablet 50 mg  50 mg Oral Daily Lauree Chandler, NP   50 mg at 11/22/22 7425   multivitamin with minerals tablet 1 tablet  1 tablet Oral Daily Lauree Chandler, NP   1 tablet at 11/22/22 0916   naproxen (NAPROSYN) tablet 500 mg  500 mg Oral Q8H PRN Eliseo Gum B, MD   500 mg at 11/21/22 1510   thiamine (VITAMIN B1) tablet 100 mg  100 mg Oral Daily Lauree Chandler, NP   100 mg at 11/22/22 0916   traZODone (DESYREL) tablet 50 mg  50 mg Oral QHS PRN Lauree Chandler, NP  zolpidem (AMBIEN) tablet 5 mg  5 mg Oral QHS PRN Lauree Chandler, NP   5 mg at 11/21/22 2114   Current Outpatient Medications  Medication Sig Dispense Refill   gabapentin (NEURONTIN) 300 MG capsule Take 300 mg by mouth 4 (four) times daily.     metoprolol succinate (TOPROL-XL) 50 MG 24 hr tablet Take 50 mg by mouth daily.     atorvastatin (LIPITOR) 40 MG tablet Take 40 mg by mouth daily.     zolpidem (AMBIEN) 10 MG tablet Take 10 mg by mouth at bedtime.      Labs  Lab Results:  Admission on 11/19/2022  Component Date Value Ref Range Status   SARS Coronavirus 2 by RT PCR 11/19/2022 NEGATIVE  NEGATIVE Final   Performed at [facility], [address]  [place], [place] [address]   WBC 11/19/2022 7.0  4.0 - 10.5 K/uL Final   RBC 11/19/2022 4.25  4.22 - 5.81 MIL/uL Final   Hemoglobin 11/19/2022 15.1  13.0 - 17.0 g/dL Final   HCT 60/45/4098 42.2  39.0 - 52.0 % Final   MCV 11/19/2022 99.3  80.0 - 100.0 fL Final   MCH 11/19/2022 35.5 (H)  26.0 - 34.0 pg Final   MCHC 11/19/2022 35.8  30.0 - 36.0 g/dL Final   RDW 11/91/4782 14.6  11.5 - 15.5 % Final   Platelets 11/19/2022 262  150 - 400 K/uL Final   nRBC 11/19/2022 0.0  0.0 - 0.2 % Final   Neutrophils Relative % 11/19/2022 70  % Final   Neutro Abs 11/19/2022 4.9  1.7 - 7.7 K/uL Final   Lymphocytes Relative 11/19/2022 23  % Final   Lymphs Abs 11/19/2022 1.6  0.7 - 4.0 K/uL Final   Monocytes Relative 11/19/2022 6  % Final   Monocytes Absolute 11/19/2022 0.4  0.1 - 1.0 K/uL Final   Eosinophils Relative 11/19/2022 0  % Final   Eosinophils Absolute 11/19/2022 0.0  0.0 - 0.5 K/uL Final   Basophils Relative 11/19/2022 1  % Final   Basophils Absolute 11/19/2022 0.1  0.0 - 0.1 K/uL Final   Immature Granulocytes 11/19/2022 0  % Final   Abs Immature Granulocytes 11/19/2022 0.02  0.00 - 0.07 K/uL Final   Performed at [facility], [address], [place], [place] [address]   Sodium 11/19/2022 134 (L)  135 - 145 mmol/L Final   Potassium 11/19/2022 4.0  3.5 - 5.1 mmol/L Final   Chloride 11/19/2022 94 (L)  98 - 111 mmol/L Final   CO2 11/19/2022 20 (L)  22 - 32 mmol/L Final   Glucose, Bld 11/19/2022 187 (H)  70 - 99 mg/dL Final   Glucose reference range applies only to samples taken after fasting for at least 8 hours.   BUN 11/19/2022 <5 (L)  6 - 20 mg/dL Final   Creatinine, Ser 11/19/2022 0.78  0.61 - 1.24 mg/dL Final   Calcium 30/86/5784 9.3  8.9 - 10.3 mg/dL Final   Total Protein 69/62/9528 7.8  6.5 - 8.1 g/dL Final   Albumin 41/32/4401 3.8  3.5 - 5.0 g/dL Final   AST 02/72/5366 179 (H)  15 - 41 U/L Final   ALT 11/19/2022 137 (H)  0 - 44 U/L Final   Alkaline Phosphatase 11/19/2022 164 (H)  38 -  126 U/L Final   Total Bilirubin 11/19/2022 0.9  0.3 - 1.2 mg/dL Final   GFR, Estimated 11/19/2022 >60  >60 mL/min Final   Comment: (NOTE) Calculated  using the CKD-EPI Creatinine Equation (2021)    Anion gap 11/19/2022 20 (H)  5 - 15 Final   Performed at [facility], [address], [place], [place] [address]   Hgb A1c MFr Bld 11/19/2022 7.8 (H)  4.8 - 5.6 % Final   Comment: (NOTE) Pre diabetes:          5.7%-6.4%  Diabetes:              >6.4%  Glycemic control for   <7.0% adults with diabetes    Mean Plasma Glucose 11/19/2022 177.16  mg/dL Final   Performed at [facility], [address] [place], [place] [address]   Alcohol, Ethyl (B) 11/19/2022 152 (H)  <10 mg/dL Final   Comment: (NOTE) Lowest detectable limit for serum alcohol is 10 mg/dL.  For medical purposes only. Performed at [facility], [address] [place], [place] [address]    TSH 11/19/2022 2.337  0.350 - 4.500 uIU/mL Final   Comment: Performed by a 3rd Generation assay with a functional sensitivity of <=0.01 uIU/mL. Performed at [facility], [address] [place], [place] [address]    POC Amphetamine UR 11/20/2022 None Detected  NONE DETECTED (Cut Off Level 1000 ng/mL) Final   POC Secobarbital (BAR) 11/20/2022 None Detected  NONE DETECTED (Cut Off Level 300 ng/mL) Final   POC Buprenorphine (BUP) 11/20/2022 None Detected  NONE DETECTED (Cut Off Level 10 ng/mL) Final   POC Oxazepam (BZO) 11/20/2022 None Detected  NONE DETECTED (Cut Off Level 300 ng/mL) Final   POC Cocaine UR 11/20/2022 None Detected  NONE DETECTED (Cut Off Level 300 ng/mL) Final   POC Methamphetamine UR 11/20/2022 None Detected  NONE DETECTED (Cut Off Level 1000 ng/mL) Final   POC Morphine 11/20/2022 None Detected  NONE DETECTED (Cut Off Level 300 ng/mL) Final   POC Methadone UR 11/20/2022 None Detected  NONE DETECTED (Cut Off Level 300 ng/mL) Final   POC Oxycodone UR 11/20/2022 None Detected  NONE DETECTED (Cut Off  Level 100 ng/mL) Final   POC Marijuana UR 11/20/2022 None Detected  NONE DETECTED (Cut Off Level 50 ng/mL) Final   Cholesterol 11/19/2022 231 (H)  0 - 200 mg/dL Final   Triglycerides 57/84/6962 212 (H)  <150 mg/dL Final   HDL 95/28/4132 37 (L)  >40 mg/dL Final   Total CHOL/HDL Ratio 11/19/2022 6.2  RATIO Final   VLDL 11/19/2022 42 (H)  0 - 40 mg/dL Final   LDL Cholesterol 11/19/2022 152 (H)  0 - 99 mg/dL Final   Comment:        Total Cholesterol/HDL:CHD Risk Coronary Heart Disease Risk Table                     Men   Women  1/2 Average Risk   3.4   3.3  Average Risk       5.0   4.4  2 X Average Risk   9.6   7.1  3 X Average Risk  23.4   11.0        Use the calculated Patient Ratio above and the CHD Risk Table to determine the patient's CHD Risk.        ATP III CLASSIFICATION (LDL):  <100     mg/dL   Optimal  440-102  mg/dL   Near or Above                    Optimal  130-159  mg/dL   Borderline  409-811  mg/dL   High  >914     mg/dL   Very High Performed at [facility], [address] [place], [place] [address]    Sodium 11/21/2022 133 (L)  135 - 145 mmol/L Final   Potassium 11/21/2022 4.6  3.5 - 5.1 mmol/L Final   Chloride 11/21/2022 94 (L)  98 - 111 mmol/L Final   CO2 11/21/2022 29  22 - 32 mmol/L Final   Glucose, Bld 11/21/2022 169 (H)  70 - 99 mg/dL Final   Glucose reference range applies only to samples taken after fasting for at least 8 hours.   BUN 11/21/2022 5 (L)  6 - 20 mg/dL Final   Creatinine, Ser 11/21/2022 0.90  0.61 - 1.24 mg/dL Final   Calcium 62/13/0865 9.1  8.9 - 10.3 mg/dL Final   Total Protein 78/46/9629 7.0  6.5 - 8.1 g/dL Final   Albumin 52/84/1324 3.5  3.5 - 5.0 g/dL Final   AST 40/05/2724 116 (H)  15 - 41 U/L Final   ALT 11/21/2022 89 (H)  0 - 44 U/L Final   Alkaline Phosphatase 11/21/2022 139 (H)  38 - 126 U/L Final   Total Bilirubin 11/21/2022 1.8 (H)  0.3 - 1.2 mg/dL Final   GFR, Estimated 11/21/2022 >60  >60 mL/min Final   Comment:  (NOTE) Calculated using the CKD-EPI Creatinine Equation (2021)    Anion gap 11/21/2022 10  5 - 15 Final   Performed at [facility], [address], [place], [place] [address]    Blood Alcohol level:  Lab Results  Component Value Date   ETH 152 (H) 11/19/2022    Metabolic Disorder Labs: Lab Results  Component Value Date   HGBA1C 7.8 (H) 11/19/2022   MPG 177.16 11/19/2022   No results found for: "PROLACTIN" Lab Results  Component Value Date   CHOL 231 (H) 11/19/2022   TRIG 212 (H) 11/19/2022   HDL 37 (L) 11/19/2022   CHOLHDL 6.2 11/19/2022   VLDL 42 (H) 11/19/2022   LDLCALC 152 (H) 11/19/2022    Therapeutic Lab Levels: No results found for: "LITHIUM" No results found for: "VALPROATE" No results found for: "CBMZ"  Physical Findings   PHQ2-9    Flowsheet Row ED from 11/19/2022 in [facility]  PHQ-2 Total Score 1      Flowsheet Row ED from 11/19/2022 in [facility]  C-SSRS RISK CATEGORY Low Risk        Musculoskeletal  Strength & Muscle Tone: within normal limits Gait & Station: normal Patient leans: N/A  Psychiatric Specialty Exam  Presentation  General Appearance:  Appropriate for Environment; Casual  Eye Contact: Good  Speech: Clear and Coherent; Normal Rate  Speech Volume: Normal  Handedness: Right   Mood and Affect  Mood: Euthymic (More elevated and restless than yesterday)  Affect: Congruent   Thought Process  Thought Processes: Coherent; Linear  Descriptions of Associations:Intact  Orientation:Full (Time, Place and Person)  Thought Content:Logical; WDL  Diagnosis of Schizophrenia or Schizoaffective disorder in past: No    Hallucinations:Hallucinations: None   Ideas of Reference:None  Suicidal Thoughts:Suicidal Thoughts: No   Homicidal Thoughts:Homicidal Thoughts: No    Sensorium  Memory: Immediate Good; Recent  Good  Judgment: Good  Insight: Good   Executive Functions  Concentration: Good  Attention Span: Good  Recall: Good  Fund of Knowledge: Good  Language: Good   Psychomotor Activity  Psychomotor  Activity: Psychomotor Activity: Increased; Restlessness    Assets  Assets: Manufacturing systems engineer; Desire for Improvement; Social Support   Sleep  Sleep: Sleep: Good    Physical Exam  Physical Exam Constitutional:      Appearance: Normal appearance.  Pulmonary:     Effort: Pulmonary effort is normal.  Neurological:     Mental Status: He is alert and oriented to person, place, and time.    Review of Systems  Psychiatric/Behavioral:  Negative for hallucinations and suicidal ideas. The patient does not have insomnia.    Blood pressure (!) 111/95, pulse 94, temperature 98.2 F (36.8 C), temperature source Oral, resp. rate 18, SpO2 95 %. There is no height or weight on file to calculate BMI.  Treatment Plan Summary: Daily contact with patient to assess and evaluate symptoms and progress in treatment and Medication management   Based on assessment patient appears to be doing fairly well, but he is only about 24hrs into detox. He endorses a hx of not withdrawal, but will continue to monitor. With his FALD, will reassess Liver enzymes and DO NOT recommend NALTREXONE unless they significantly decrease.  Will continue Ativan CIWA PRN protocol. Patient also appears to be a new diabetic will consult to DM coordinator. IN regards to SA, patient has been severely intoxicated for sometimes and this Etoh intake could have led to further depression, however due to multiple losses and ongoing and medically unaddressed depressive symptoms patient agreeable to SSRI.  Etoh use d/o, severe Sedative- hypnotic abuse (Ambien) - Ambien decreased by admitting provider from  QHS to  QHS PRN, will continue - Continue gabapentin  TID PRN - CIWA w/ ATIVAN PRN -Atarax  TID,  anxiety -Trazodone  QHS, insomnia  - Zofran  q6h , nausea -Naproxen  q8h PRN, pain -loperamide 2-4mg , diarrhea  MDD, r/o bipolar 1 disorder - Discontinue Zoloft 25 mg, as patient with some signs of mood activation but no true mania.  - START Lamictal 25 mg daily x 14 doses, then increase to 50 mg on 4/30. Risks and benefits explained to patient and he agrees to medication trial.   HLD - Continue home atorvastatin  daily  T2DM - Referral to DM coordinator placed; diabetes team does not service [facility].  - Scheduled PCP appointment for patient 4/19 at 12:00 PM   FALD - Repeat LFTs today downtrending appropriately  Dispo: Home tomorrow, CDIOP at [facility] 4/22 at 1:00 PM  PGY-2 Lamar Sprinkles, MD 11/22/2022 4:02 PM

## 2022-11-22 NOTE — Discharge Instructions (Addendum)
Follow-up recommendations:  Activity:  Normal, as tolerated Diet:  Per PCP recommendation  Patient is instructed prior to discharge to: Take all medications as prescribed by his mental healthcare provider. Report any adverse effects and/or reactions from the medicines to his outpatient provider promptly. Patient has been instructed & cautioned: To not engage in alcohol and or illegal drug use while on prescription medicines.  In the event of worsening symptoms, patient is instructed to call the crisis hotline at 988, 911 and or go to the nearest ED for appropriate evaluation and treatment of symptoms. To follow-up with his primary care provider for your other medical issues, concerns and or health care needs.   Writer coordinated a intake appointment with the [facility] CD[facility].  Patient has scheduled appointment on Monday, 11/28/22 at 1 p.m. on the 2nd floor at the  [facility] [address] [place], [place], [address] [phone] phone  New Patient Assessment/Therapy Walk-Ins:  Monday and Wednesday: 8 am until slots are full. Every 1st and 2nd Fridays of the month: 1 pm - 5 pm.  NO ASSESSMENT/THERAPY WALK-INS ON TUESDAYS OR THURSDAYS  New Patient Assessment/Medication Management Walk-Ins:  Monday - Friday:  8 am - 11 am.  For all walk-ins, we ask that you arrive by 7:30 am because patients will be seen in the order of arrival.  Availability is limited; therefore, you may not be seen on the same day that you walk-in.  Our goal is to serve and meet the needs of our community to the best of our ability.  SUBSTANCE USE TREATMENT for Medicaid and State Funded/IPRS  [facility] [address], [place], [address] [phone] phone NOTE: [facility] is no longer offering IOP services.  Serves those who are low-income or have no insurance.  [facility] [address] [place], [place], [address] [phone] phone [phone]  fax NOTE: Does have Substance Abuse-Intensive Outpatient Program [facility]) as well as transitional housing if eligible.  [facility] [address] [place], [place], [address] [phone] phone [phone] fax  [facility] [phone] [place], [place], [address] [phone] phone [phone] fax  HALFWAY HOUSES:  [facility] Admissions Coordinator [phone]  [facility] www.oxfordvacancies.com  12 STEP PROGRAMS:  Alcoholics Anonymous of [employer]  Narcotics Anonymous of  [employer]  Al-Anon of [place], [place] www.[place]alanon.org/find-meetings.html  Nar-Anon https://nar-anon.org/find-a-meetin  List of Residential placements:   [facility] in [place]: [phone]  [facility]: [phone]  [place], [place] [phone]: Male and male facility; 30-day program: (uninsured and Medicaid such as Laurena Bering, [place], [place], partners)  [facility]: [phone]; men and women's facility; 28 days; Can have Medicaid tailored plan Tour manager or Partners)  [facility]: [phone] Karoline Caldwell or Larita Fife; 28 day program; must be fully detox; tailored Medicaid or no insurance  [address] in [place], [place]; [phone]; 28 day all males program; no insurance accepted  [facility] Referral in [place]: Gabriel Rung [phone] (no insurance or Medicaid only); 90 days; outpatient services but provide housing in apartments downtown [place]  [facility] Admission: [phone]: Patient must complete phone screening for placement: [place], [place]; 6 month program; uninsured, Medicaid, and [employer].   [facility]: no insurance required; [phone]  [facility]: [phone]; Intake: Molly Maduro; Must fill out application online; Alecia Lemming Delay [phone] x [address] Mission in [place], [place]: [phone]; Admissions Coordinators Mr. Maurine Minister or Barron Alvine; 90 day program.  [facility]: [place], [place] [phone]; Co-Ed 9 month to a year program; Agricultural consultant; Men entry fee is $500 (6-87months);  [employer]: [address] [place], [place] [address]; no fee or insurance required; minimum of 2 years; Highly structured; work based; Intake Coordinator is Thayer Ohm [phone]  [facility] in [place], [place]: [phone]; Fax number is [phone]; website: www.[facility].org; Requires 3-6 page autobiography; 2 year program (18 months and then 67month transitional housing); Admission fee is $300; no insurance needed; work Automotive engineer in [place], [place]: [employer] Desk Staff: Danise Edge [phone]: They have a Men's Regenerations Program 6-64months. Free program; There is an initial $300 fee however, they are willing to work with patients regarding that. Application is online.  [facility]: Admissions [phone] Doran Heater ext [phone]; Any 7-90 day program is out of pocket; 12 month program is free of charge; there is a $275 entry fee; Patient is responsible for own transportation  [facility]: [phone]: Ask about specific location  [facility]: Male and Male facility; [phone]  [facility] in [place], [place] [phone] or [phone]  [facility]: Main: [phone]

## 2022-11-23 DIAGNOSIS — F39 Unspecified mood [affective] disorder: Secondary | ICD-10-CM

## 2022-11-23 DIAGNOSIS — F1010 Alcohol abuse, uncomplicated: Secondary | ICD-10-CM | POA: Diagnosis not present

## 2022-11-23 MED ORDER — LAMOTRIGINE 25 MG PO TABS: 25.0000 mg | ORAL_TABLET | Freq: Every day | ORAL | 0 refills | Status: DC

## 2022-11-23 MED ORDER — GABAPENTIN 300 MG PO CAPS: 300.0000 mg | ORAL_CAPSULE | Freq: Three times a day (TID) | ORAL | 0 refills | Status: DC

## 2022-11-23 MED ORDER — ZOLPIDEM TARTRATE 5 MG PO TABS: 5.0000 mg | ORAL_TABLET | Freq: Every evening | ORAL | 0 refills | Status: DC | PRN

## 2022-11-23 NOTE — ED Notes (Signed)
Patient out of assigned room in the hallway and in the day room. Converses with staff and other patients on the unit. Patients expresses the desire to discharge. Explained the procedure to patient. Breakfast provided in the day room to patient. Safety maintained and will continue to monitor.

## 2022-11-23 NOTE — ED Notes (Signed)
Patient is sleeping. Respirations equal and unlabored, skin warm and dry. No change in assessment or acuity. Routine safety checks conducted according to facility protocol. Will continue to monitor for safety.   

## 2022-11-23 NOTE — ED Notes (Signed)
Pt observed/assessed in room sleeping. RR even and unlabored, appearing in no noted distress. Environmental check complete, will continue to monitor for safety 

## 2022-11-23 NOTE — ED Notes (Signed)
Discharge instructions provided and Pt stated understanding. Pt alert, orient and ambulatory prior to d/c from facility. No personal belongings to be returned from a locker. Wife took everything with her at the time of admittance. Prescriptions sent to pharmacy of patients choice. Escorted patient to the lobby to wait for his wife to pick him up. Safety maintained.

## 2022-11-23 NOTE — ED Provider Notes (Signed)
FBC/OBS ASAP Discharge Summary  Date and Time: 11/23/2022 8:17 AM  Name: Logan Martinez  MRN:  [id number]   Discharge Diagnoses:  Final diagnoses:  Alcohol abuse  Unspecified mood (affective) disorder    Subjective: Logan Martinez is a is a 57 y/o male w/ history of alcohol abuse, hypercholesteremia, presenting to [facility] on 11/19/22 with his wife for suicidal ideations and alcohol/BZD detox, for which he was admitted to [facility]   Stay Summary: The patient was evaluated each day by a clinical provider to ascertain response to treatment. Improvement was noted by the patient's report of decreasing symptoms, improved sleep and appetite, affect, medication tolerance, behavior, and participation in unit programming.  Patient was asked each day to complete a self inventory noting mood, mental status, pain, new symptoms, anxiety and concerns.   Patient responded well to medication and being in a therapeutic and supportive environment. Positive and appropriate behavior was noted and the patient was motivated for recovery. The patient worked closely with the treatment team and case manager to develop a discharge plan with appropriate goals. Coping skills, problem solving as well as relaxation therapies were also part of the unit programming.   By the day of discharge patient was in much improved condition than upon admission.  Symptoms were reported as significantly decreased or resolved completely. The patient denied SI/HI and voiced no AVH. The patient was motivated to continue taking medication with a goal of continued improvement in mental health.    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 Prescriptions: ambein, metoprolol Over the Counter: none History of alcohol / drug use?: Yes Longest period of sobriety (when/how long): 5-6 days Negative Consequences of Use: Legal (Pt  was charged with DUI last year. Had license suspended, paid a fine and attend classes.) Withdrawal Symptoms: None Name of Substance 1: Alcohol 1 - Age of First Use: [age] 1 - Amount (size/oz): 1-2 1 - Frequency: daily 1 - Duration: 3 years 1 - Last Use / Amount: this morning/quart of wine 1 - Method of Aquiring: pruchase 1- Route of Use: drinking  Tobacco Cessation:  N/A, patient does not currently use tobacco products  Current Medications:  Current Facility-Administered Medications  Medication Dose Route Frequency Provider Last Rate Last Admin   atorvastatin (LIPITOR) tablet 40 mg  40 mg Oral Daily Eliseo Gum B, MD   40 mg at 11/22/22 0916   gabapentin (NEURONTIN) capsule 300 mg  300 mg Oral TID Lauree Chandler, NP   300 mg at 11/22/22 2109   lamoTRIgine (LAMICTAL) tablet 25 mg  25 mg Oral Daily Lamar Sprinkles, MD   25 mg at 11/22/22 1700   metoprolol succinate (TOPROL-XL) 24 hr tablet 50 mg  50 mg Oral Daily Lauree Chandler, NP   50 mg at 11/22/22 9147   multivitamin with minerals tablet 1 tablet  1 tablet Oral Daily Lauree Chandler, NP   1 tablet at 11/22/22 0916   naproxen (NAPROSYN) tablet 500 mg  500 mg Oral Q8H PRN Bobbye Morton, MD   500  mg at 11/21/22 1510   thiamine (VITAMIN B1) tablet 100 mg  100 mg Oral Daily Lauree Chandler, NP   100 mg at 11/22/22 1610   traZODone (DESYREL) tablet 50 mg  50 mg Oral QHS PRN Lauree Chandler, NP       zolpidem [facility]) tablet 5 mg  5 mg Oral QHS PRN Lauree Chandler, NP   5 mg at 11/22/22 2108   Current Outpatient Medications  Medication Sig Dispense Refill   gabapentin (NEURONTIN) 300 MG capsule Take 300 mg by mouth 4 (four) times  daily.     metoprolol succinate (TOPROL-XL) 50 MG 24 hr tablet Take 50 mg by mouth daily.     atorvastatin (LIPITOR) 40 MG tablet Take 40 mg by mouth daily.     zolpidem (AMBIEN) 10 MG tablet Take 10 mg by mouth at bedtime.      PTA Medications:  Facility Ordered Medications  Medication   [COMPLETED] thiamine (VITAMIN B1) injection 100 mg   thiamine (VITAMIN B1) tablet 100 mg   multivitamin with minerals tablet 1 tablet   [EXPIRED] LORazepam (ATIVAN) tablet 1 mg   [EXPIRED] hydrOXYzine (ATARAX) tablet 25 mg   [EXPIRED] loperamide (IMODIUM) capsule 2-4 mg   [EXPIRED] ondansetron (ZOFRAN-ODT) disintegrating tablet 4 mg   gabapentin (NEURONTIN) capsule 300 mg   metoprolol succinate (TOPROL-XL) 24 hr tablet 50 mg   zolpidem (AMBIEN) tablet 5 mg   traZODone (DESYREL) tablet 50 mg   atorvastatin (LIPITOR) tablet 40 mg   naproxen (NAPROSYN) tablet 500 mg   lamoTRIgine (LAMICTAL) tablet 25 mg   PTA Medications  Medication Sig   gabapentin (NEURONTIN) 300 MG capsule Take 300 mg by mouth 4 (four) times daily.   metoprolol succinate (TOPROL-XL) 50 MG 24 hr tablet Take 50 mg by mouth daily.   atorvastatin (LIPITOR) 40 MG tablet Take 40 mg by mouth daily.   zolpidem (AMBIEN) 10 MG tablet Take 10 mg by mouth at bedtime.       11/22/2022    3:21 PM 11/20/2022   10:32 AM  Depression screen PHQ 2/9  Decreased Interest 0 0  Down, Depressed, Hopeless 1 1  PHQ - 2 Score 1 1    Flowsheet Row ED from 11/19/2022 in [facility]  C-SSRS RISK CATEGORY Low Risk       Musculoskeletal  Strength & Muscle Tone: within normal limits Gait & Station: normal Patient leans: Front  Psychiatric Specialty Exam  Presentation  General Appearance:  Appropriate for Environment; Casual  Eye Contact: Good  Speech: Clear and Coherent; Normal Rate  Speech Volume: Normal  Handedness: Right   Mood and Affect  Mood: Euthymic (More elevated and restless than  yesterday)  Affect: Congruent   Thought Process  Thought Processes: Coherent; Linear  Descriptions of Associations:Intact  Orientation:Full (Time, Place and Person)  Thought Content:Logical; WDL  Diagnosis of Schizophrenia or Schizoaffective disorder in past: No    Hallucinations:Hallucinations: None  Ideas of Reference:None  Suicidal Thoughts:Suicidal Thoughts: No  Homicidal Thoughts:Homicidal Thoughts: No   Sensorium  Memory: Immediate Good; Recent Good  Judgment: Good  Insight: Good   Executive Functions  Concentration: Good  Attention Span: Good  Recall: Good  Fund of Knowledge: Good  Language: Good   Psychomotor Activity  Psychomotor Activity: Psychomotor Activity: Increased; Restlessness   Assets  Assets: Manufacturing systems engineer; Desire for Improvement; Social Support   Sleep  Sleep: Sleep: Good   Physical Exam  Physical Exam Constitutional:  Appearance: Normal appearance.  Pulmonary:     Effort: Pulmonary effort is normal.  Neurological:     Mental Status: He is alert and oriented to person, place, and time.      Review of Systems  Psychiatric/Behavioral:  Negative for hallucinations and suicidal ideas. The patient does not have insomnia.  Blood pressure (!) 139/99, pulse 78, temperature 98.6 F (37 C), temperature source Oral, resp. rate 17, SpO2 96 %. There is no height or weight on file to calculate BMI.  Demographic Factors:  Male, Divorced or widowed, Caucasian, and Unemployed  Loss Factors: Decrease in vocational status, Loss of significant relationship, Decline in physical health, and Financial problems/change in socioeconomic status  Historical Factors: NA  Risk Reduction Factors:   Sense of responsibility to family and Positive social support  Continued Clinical Symptoms:  Bipolar Disorder:   Depressive phase Alcohol/Substance Abuse/Dependencies Unstable or Poor Therapeutic Relationship  Cognitive  Features That Contribute To Risk:  None    Suicide Risk:  Minimal: No identifiable suicidal ideation.  Patients presenting with no risk factors but with morbid ruminations; may be classified as minimal risk based on the severity of the depressive symptoms  Plan Of Care/Follow-up recommendations:  Follow-up recommendations:  Activity:  Normal, as tolerated Diet:  Per PCP recommendation  Patient is instructed prior to discharge to: Take all medications as prescribed by his mental healthcare provider. Report any adverse effects and/or reactions from the medicines to his outpatient provider promptly. Patient has been instructed & cautioned: To not engage in alcohol and or illegal drug use while on prescription medicines.  In the event of worsening symptoms, patient is instructed to call the crisis hotline at 988, 911 and or go to the nearest ED for appropriate evaluation and treatment of symptoms. To follow-up with his primary care provider for your other medical issues, concerns and or health care needs.   Disposition: Home with outpatient follow-up.  Lamar Sprinkles, MD 11/23/2022, 8:17 AM

## 2022-11-25 DIAGNOSIS — F1021 Alcohol dependence, in remission: Secondary | ICD-10-CM | POA: Diagnosis not present

## 2022-11-25 DIAGNOSIS — R7303 Prediabetes: Secondary | ICD-10-CM | POA: Diagnosis not present

## 2022-11-25 DIAGNOSIS — G4700 Insomnia, unspecified: Secondary | ICD-10-CM | POA: Diagnosis not present

## 2022-11-25 DIAGNOSIS — R748 Abnormal levels of other serum enzymes: Secondary | ICD-10-CM | POA: Diagnosis not present

## 2022-11-28 ENCOUNTER — Ambulatory Visit (INDEPENDENT_AMBULATORY_CARE_PROVIDER_SITE_OTHER): Payer: BC Managed Care – PPO | Admitting: Licensed Clinical Social Worker

## 2022-11-28 ENCOUNTER — Encounter (HOSPITAL_COMMUNITY): Payer: Self-pay

## 2022-11-28 DIAGNOSIS — F39 Unspecified mood [affective] disorder: Secondary | ICD-10-CM

## 2022-11-28 DIAGNOSIS — F17200 Nicotine dependence, unspecified, uncomplicated: Secondary | ICD-10-CM

## 2022-11-28 DIAGNOSIS — F1020 Alcohol dependence, uncomplicated: Secondary | ICD-10-CM

## 2022-11-28 NOTE — Progress Notes (Signed)
The client presents today saying that he goes by his middle name, "Logan Martinez'" rather an [place]. He says that he feels like his "old self' wondering if it may be the effects of the only 25 mg or Lamictal started while inpatient at the [facility].   The therapist reviews his previous CCA with Logan Martinez saying that the employment information in the CCA is not correct. 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 He had a maintenance call in the middle-of-the-night and was pulled for a DWI blowing a .12. Consequently, he lost his job.   Logan Martinez says that he is originally from [place] and moved to [place] around age 58. When asked about his childhood, he responds, " I can't think of anything better"  saying that [place]," "had a lot of friends," and his "parents were good."  He denies any family history for addiction on either side. He says that he thinks that his maternal grandmother had some sort of mental illness.   Logan Martinez says that he takes Gabapentin for Neuropathy in his feet not knowing the cause of his Neuropathy. He also has been diagnosed with high blood pressure. He says that he has had sleep problems his entire life. He has been on Ambien for about 10 years saying that he only started overtaking it last week. His Ambien has been discontinued and his PCP recently started him on 50 mg of Trazodone. He says that the Trazodone does not seem to be doing much as he woke up on Thursday at 6 a.m. and did not sleep Friday or Saturday at all but crashed last night.    Logan Martinez says that he did not really do a lot of drinking until around age 29. He started out having a drink after work saying that it progressed over the years. Four to five years ago, he says that he knew that he was drinking more than he should and about 3 years ago, he knew it was a problem as he would drink during the day getting a little bottle of wine after lunch. This last  year, he says that his drinking "really started taking off."He reports that he last drunk alcohol on 11/19/22. He says that he had about one month of sobriety about 6 months ago; however, he drank after his wife gave him the impression she was going to end their 26 year marriage.   Other than alcohol, he says that he dips tobacco using about a can every other day. Presently, he says that his wife and best friend are supports. He says that he does not want to attend AA as he hears that it is religious and does not want religion forced down his throat. He says that he knows that he is an alcoholic and wants to quit drinking indefinitely.  The therapist answers his questions about the [facility] IOP program and talks to him about the different levels of care and about SMART Recovery. He is interested in starting [facility] IOP as of 12/02/22. He notes that he is good with doing the group check-in; however, does not want to be in a group in which he is forced to talk.   [address], MA, LCSW, [facility], LCAS 11/28/2022

## 2022-12-02 ENCOUNTER — Ambulatory Visit (INDEPENDENT_AMBULATORY_CARE_PROVIDER_SITE_OTHER): Payer: BC Managed Care – PPO | Admitting: Licensed Clinical Social Worker

## 2022-12-02 DIAGNOSIS — F39 Unspecified mood [affective] disorder: Secondary | ICD-10-CM

## 2022-12-02 DIAGNOSIS — F17200 Nicotine dependence, unspecified, uncomplicated: Secondary | ICD-10-CM

## 2022-12-02 DIAGNOSIS — F1020 Alcohol dependence, uncomplicated: Secondary | ICD-10-CM

## 2022-12-04 NOTE — Progress Notes (Signed)
Daily Group Progress Note   Program: [facility] IOP     Individual Time: 9 a.m. to 12 p.m.   Type of Therapy: Process and Psychoeducational    Topic: The therapist checks in with group members, assesses for SI/HI/psychosis and overall level of functioning. The therapist inquires about sobriety date and number of community support meetings attended since last session.    The therapist informs group members not present at last group of another group member's passing. The therapist has group members share their stories of how they came to be in [facility] IOP for a new group member starting today. The therapist primarily focuses on working to get group members to understand the things that their disease will tell them to justify using and to get them to ignore all of the data pointing to the fact that using will definitely cause them to continue to have serious problems resulting in "jails, institutions, or death." The therapist continues to emphasize the importance of avoiding triggers and recommends having non-using support remove old stashes which represent the "things" in "people, places and things."    Summary: Logan Martinez presents rating his depression as a "0" and his anxiety as a "0."    He is mostly quiet but attentive through his initial group. He tells the group members that he feels "excited" and "nervous."  At the conclusion of group, he notes that this was the fastest three hours he has experienced saying that he cannot belief group is over. He says that he enjoyed group and looks forward to attending again.    Progress Towards Goals: Logan Martinez reports no alcohol use.    UDS collected: No Results: No   AA/NA attended?: No   Sponsor?: No   Myrna Blazer, MA, LCSW, [facility], LCAS 12/02/2022

## 2022-12-05 ENCOUNTER — Ambulatory Visit (INDEPENDENT_AMBULATORY_CARE_PROVIDER_SITE_OTHER): Payer: BC Managed Care – PPO | Admitting: Licensed Clinical Social Worker

## 2022-12-05 VITALS — BP 142/105 | HR 97 | Ht 71.0 in | Wt 210.2 lb

## 2022-12-05 DIAGNOSIS — E1142 Type 2 diabetes mellitus with diabetic polyneuropathy: Secondary | ICD-10-CM

## 2022-12-05 DIAGNOSIS — G609 Hereditary and idiopathic neuropathy, unspecified: Secondary | ICD-10-CM

## 2022-12-05 DIAGNOSIS — F17200 Nicotine dependence, unspecified, uncomplicated: Secondary | ICD-10-CM | POA: Diagnosis not present

## 2022-12-05 DIAGNOSIS — F4321 Adjustment disorder with depressed mood: Secondary | ICD-10-CM | POA: Diagnosis not present

## 2022-12-05 DIAGNOSIS — D7589 Other specified diseases of blood and blood-forming organs: Secondary | ICD-10-CM

## 2022-12-05 DIAGNOSIS — I10 Essential (primary) hypertension: Secondary | ICD-10-CM

## 2022-12-05 DIAGNOSIS — F1020 Alcohol dependence, uncomplicated: Secondary | ICD-10-CM

## 2022-12-05 DIAGNOSIS — E7800 Pure hypercholesterolemia, unspecified: Secondary | ICD-10-CM

## 2022-12-05 DIAGNOSIS — K7010 Alcoholic hepatitis without ascites: Secondary | ICD-10-CM

## 2022-12-05 DIAGNOSIS — F39 Unspecified mood [affective] disorder: Secondary | ICD-10-CM

## 2022-12-05 MED ORDER — LAMOTRIGINE 100 MG PO TABS: 100.0000 mg | ORAL_TABLET | Freq: Every day | ORAL | 2 refills | Status: DC

## 2022-12-05 NOTE — Progress Notes (Incomplete)
Psychiatric Initial Adult Assessment   Patient Identification: Logan Martinez MRN:  [id number] Date of Evaluation:  12/07/2022 11:00 am (Admission 12/02/2022) Referral Source: FBC/OBS  Chief Complaint:  No chief complaint on file.  Visit Diagnosis:    ICD-10-CM   1. Alcohol use disorder, severe, dependence (HCC)  F10.20    Seems to have significant resistance to outside support group/denial    2. Tobacco use disorder  F17.200     3. Unspecified mood (affective) disorder (HCC)  F39     4. Idiopathic peripheral neuropathy  G60.9    ? alcohol related vs Diabetes    5. Hypercholesteremia  E78.00     6. Type 2 diabetes mellitus with diabetic polyneuropathy, without long-term current use of insulin (HCC)  E11.42    Untreated    7. Alcoholic hepatitis without ascites  K70.10       History of Present Illness:   On 11/19/2022 Pt presented voluntarily to [facility] for walk-in assessment.  Pt is accompanied by his wife, Logan Martinez, stating  "Really bad couple of weeks. I don't want to do it anymore." Per pt's wife, pt texted her today that he is thinking of ways to "get it over with and can't stop crying". This was uncharacteristic of him and prompted her to bring pt to this facility for psychiatric evaluation. When asked about this, pt reports he was "crying out for help". He reports having thoughts at the time that he wanted to die. He denies have any plan, although reports "thinking anything I can do to get it over with". He reports he took 2 10mg  Ambien and drank alcohol. He denies this was suicide attempt and was an attempt to "try to sleep" so he would stop feeling how he is feeling  Pt reports using 2 quarts of wine/day for the past 2 years. Per pt's wife, he has been using alcohol daily for a much longer period of time. They deny knowledge of withdrawal seizures or delirium tremens. Pt's wife states he has never attempted sobriety before. Pt reports use of "dip" nicotine.  He denies of marijuana, crack/cocaine, methamphetamine, other substances.  Patient admitted to continuous assessment for stabilization and treatment.  On 11/22/2022 [facility]  Logan Martinez  coordinated a intake appointment with the [facility] CD-I[facility] program.  Patient has scheduled appointment on  Monday, 11/28/22 at 1 p.m. on the 2nd floor at the [facility]  On 11/23/2022 Pt was discharged: Discharge Diagnoses:  Final diagnoses:  Alcohol abuse  Unspecified mood (affective) disorder  On 11/28/2022 Pt was was seen by Logan Martinez CD IOP Counselor " Weston Brass says that he takes Gabapentin for Neuropathy in his feet not knowing the cause of his Neuropathy. He also has been diagnosed with high blood pressure. He says that he has had sleep problems his entire life. He has been on Ambien for about 10 years saying that he only started overtaking it last week. His Ambien has been discontinued and his PCP recently started him on 50 mg of Trazodone. He says that the Trazodone does not seem to be doing much as he woke up on Thursday at 6 a.m. and did not sleep Friday or Saturday at all but crashed last night.     Weston Brass says that he did not really do a lot of drinking until around age 12. He started out having a drink after work saying that it progressed over the years. Four to five years ago, he  says that he knew that he was drinking more than he should and about 3 years ago, he knew it was a problem as he would drink during the day getting a little bottle of wine after lunch. This last year, he says that his drinking "really started taking off."He reports that he last drunk alcohol on 11/19/22. He says that he had about one month of sobriety about 6 months ago; however, he drank after his wife gave him the impression she was going to end their 23 year marriage.    Other than alcohol, he says that he dips tobacco using about a can every other day. Presently, he says that his wife and best friend are  supports. He says that he does not want to attend AA as he hears that it is religious and does not want religion forced down his throat. He says that he knows that he is an alcoholic and wants to quit drinking indefinitely. He is interested in starting CD IOP as of 12/02/22."  In speaking with him today,,he says he is doing very well with no interest in alcohol and no cravings.He no longer holds a prejudice toward AA as "religion" from Group conversations descrinbing differentiation between religion and spirituality of an individual's belief. He has completed introductory dosing of Lamictal without rash or problems.He is willing to go to 100 mg dose.Marland KitchenHe denies any other problems/concernes and is happy to be in CD IOP  Associated Signs/Symptoms: Depression Symptoms:  depressed mood, anhedonia, insomnia, difficulty concentrating, loss of energy/fatigue, decreased appetite, PHQ 9 score  11/28/2022 16 (Pts previous PHQ 9 scores on 4/14 and 4/16 while hopitalzed were negative witha PHq 2 score of 1 ?!)  (Hypo) Manic Symptoms:   Denies  Anxiety Symptoms:  GAD 7  11/28/2022 . Feeling Nervous, Anxious, or on Edge Not at all sure   2. Not Being Able to Stop or Control Worrying Nearly every day   3. Worrying Too Much About Different Things Nearly every day`   4. Trouble Relaxing Nearly every day   5. Being So Restless it's Hard To Sit Still Not at all sure   6. Becoming Easily Annoyed or Irritable Not at all sure   7. Feeling Afraid As If Something Awful Might Happen Not at all sure   Total GAD-7 Score 9   Anxiety Difficulty    Difficulty At Work, Home, or Getting Along With Others? Some    Psychotic Symptoms:   Denies PTSD Symptoms Pt reports father passed away 3 years ago. His younger brother passed away 2 years ago. His mother was taken off of life support in October 2023 C/O Chronic feelings of emptiness  Past Psychiatric History:  Pt denies history of non suicidal self injurious  behavior, suicide attempt or inpatient psychiatric hospitalization.   Previous Psychotropic Medications: No   Substance Abuse History in the last 12 months:  Yes.   2 quarts of wine/day for the past 2 years. (Per pt's wife, he has been using alcohol daily for a much longer period of time. ) He has been on Ambien for about 10 years saying that he only started overtaking it last week.    Consequences of Substance Abuse: {BHH CONSEQUENCES OF SUBSTANCE ABUSE:22880}  Past Medical History:  Past Medical History:  Diagnosis Date   Hypercholesteremia     Past Surgical History:  Procedure Laterality Date   EXPLORATORY LAPAROTOMY      Family Psychiatric History: ***  Family History:  Family History  Problem  Relation Age of Onset   Diabetes Other     Social History:   Social History   Socioeconomic History   Marital status: Married    Spouse name: Not on file   Number of children: Not on file   Years of education: Not on file   Highest education level: Not on file  Occupational History   Not on file  Tobacco Use   Smoking status: Every Day   Smokeless tobacco: Never  Substance and Sexual Activity   Alcohol use: Yes    Comment: social   Drug use: No   Sexual activity: Yes  Other Topics Concern   Not on file  Social History Narrative   Not on file   Social Determinants of Health   Financial Resource Strain: Not on file  Food Insecurity: Not on file  Transportation Needs: Not on file  Physical Activity: Not on file  Stress: Not on file  Social Connections: Not on file    Additional Social History: ***  Allergies:  No Known Allergies  Metabolic Disorder Labs: Lab Results  Component Value Date   HGBA1C 7.8 (H) 11/19/2022   MPG 177.16 11/19/2022   No results found for: "PROLACTIN" Lab Results  Component Value Date   CHOL 231 (H) 11/19/2022   TRIG 212 (H) 11/19/2022   HDL 37 (L) 11/19/2022   CHOLHDL 6.2 11/19/2022   VLDL 42 (H) 11/19/2022   LDLCALC 152  (H) 11/19/2022   Lab Results  Component Value Date   TSH 2.337 11/19/2022    Therapeutic Level Labs: No results found for: "LITHIUM" No results found for: "CBMZ" No results found for: "VALPROATE"  Current Medications: Current Outpatient Medications  Medication Sig Dispense Refill   lamoTRIgine (LAMICTAL) 100 MG tablet Take 1 tablet (100 mg total) by mouth daily. 30 tablet 2   atorvastatin (LIPITOR) 40 MG tablet Take 40 mg by mouth daily.     gabapentin (NEURONTIN) 300 MG capsule Take 1 capsule (300 mg total) by mouth 3 (three) times daily. 90 capsule 0   metoprolol succinate (TOPROL-XL) 50 MG 24 hr tablet Take 50 mg by mouth daily.     zolpidem (AMBIEN) 5 MG tablet Take 1 tablet (5 mg total) by mouth at bedtime as needed for up to 15 days for sleep. 15 tablet 0   No current facility-administered medications for this visit.    Musculoskeletal: Strength & Muscle Tone: {desc; muscle tone:32375} Gait & Station: {PE GAIT ED NATL:22525} Patient leans: {Patient Leans:21022755}  Psychiatric Specialty Exam: Review of Systems  Blood pressure (!) 142/105, pulse 97, height 5\' 11"  (1.803 m), weight 210 lb 3.2 oz (95.3 kg).Body mass index is 29.32 kg/m.  General Appearance: {Appearance:22683}  Eye Contact:  {BHH EYE CONTACT:22684}  Speech:  {Speech:22685}  Volume:  {Volume (PAA):22686}  Mood:  {BHH MOOD:22306}  Affect:  {Affect (PAA):22687}  Thought Process:  {Thought Process (PAA):22688}  Orientation:  {BHH ORIENTATION (PAA):22689}  Thought Content:  {Thought Content:22690}  Suicidal Thoughts:  {ST/HT (PAA):22692}  Homicidal Thoughts:  {ST/HT (PAA):22692}  Memory:  {BHH MEMORY:22881}  Judgement:  {Judgement (PAA):22694}  Insight:  {Insight (PAA):22695}  Psychomotor Activity:  {Psychomotor (PAA):22696}  Concentration:  {Concentration:21399}  Recall:  {BHH GOOD/FAIR/POOR:22877}  Fund of Knowledge:{BHH GOOD/FAIR/POOR:22877}  Language: {BHH GOOD/FAIR/POOR:22877}  Akathisia:  {BHH  YES OR NO:22294}  Handed:  {Handed:22697}  AIMS (if indicated):  {Desc; done/not:10129}  Assets:  {Assets (PAA):22698}  ADL's:  {BHH GEX'B:28413}  Cognition: {chl bhh cognition:304700322}  Sleep:  {BHH GOOD/FAIR/POOR:22877}  Screenings: GAD-7    Advertising copywriter from 11/28/2022 in [facility]  Total GAD-7 Score 9      PHQ2-9    Flowsheet Row Counselor from 11/28/2022 in [facility] ED from 11/19/2022 in [facility]  PHQ-2 Total Score 6 1  PHQ-9 Total Score 16 --      Flowsheet Row ED from 11/19/2022 in [facility]  C-SSRS RISK CATEGORY Low Risk       Assessment and Plan: ***   Maryjean Morn, PA-C 12/07/2022 11:00 am

## 2022-12-05 NOTE — Progress Notes (Signed)
Daily Group Progress Note   Program: CD IOP     Individual Time: 9 a.m. to 12 p.m.   Type of Therapy: Process and Psychoeducational    Topic: The therapist checks in with group members, assesses for SI/HI/psychosis and overall level of functioning. The therapist inquires about sobriety date and number of community support meetings attended since last session.   The therapist discusses what is meant by being on the "pink cloud" and explains what PAWS is and the reason that people will almost certainly fall off the pink cloud while working to get their first year of sobriety. The therapist explains what is meant by catastropizing and how to stop this via thought stopping techniques and using more rational self-talk. The therapist allows a group member to introduce group members to a free "12 Step" app.   The therapist explains that if one wishes to determine if he or she is an alcoholic or an addict, then the main criteria on which to focus is on loss of control. The therapist notes that non-alcoholic or social drinkers never lose control and puzzle at the reason that alcoholics "just can't have one drink" while alcoholic puzzle at how social drinkers can stop at one drink.    Summary: Logan Martinez presents rating his depression as a "0" and his anxiety as a "0."    Logan Martinez shares his story with group saying that he was a "social drinker" until around age 57. He says that his father passed away three years ago after which he lost his brother and his mother. He says that his father was more like a friend than a father and that his brother was his best friend. He notes that he has never gotten over his brother's death tearing up briefly.  Logan Martinez describes having an idyllic childhood. During his CCA with this therapist, he indicated that he had no family history of alcoholism; however, in sharing his story today, he says that he has a brother who is an alcoholic who never moved out of their parents' home. The  brother got laid off at some point and never went back to work. The therapist asks if [name]'s parents had problems with this brother living with them and drinking while not working with Logan Martinez responding that they did not.   Logan Martinez says that he would normally drink to get a "good buzz" but his drinking got worse over the past year after the death of his mother. He says that he got a DWI a year ago. His wife eventually threatened to leave him which he did not believe she would do; however, two to three weeks ago, he sent her some concerning text messages while drinking which led to his inpatient admission and then attending this group.    Progress Towards Goals: Logan Martinez reports no alcohol use.    UDS collected: Yes Results: No   AA/NA attended?: No   Sponsor?: No   Myrna Blazer, MA, LCSW, [facility], LCAS 12/05/2022

## 2022-12-06 DIAGNOSIS — F1021 Alcohol dependence, in remission: Secondary | ICD-10-CM | POA: Diagnosis not present

## 2022-12-06 DIAGNOSIS — G4700 Insomnia, unspecified: Secondary | ICD-10-CM | POA: Diagnosis not present

## 2022-12-07 ENCOUNTER — Encounter (HOSPITAL_COMMUNITY): Payer: Self-pay | Admitting: Licensed Clinical Social Worker

## 2022-12-07 ENCOUNTER — Ambulatory Visit (HOSPITAL_COMMUNITY): Payer: BC Managed Care – PPO | Admitting: Licensed Clinical Social Worker

## 2022-12-07 DIAGNOSIS — F17200 Nicotine dependence, unspecified, uncomplicated: Secondary | ICD-10-CM

## 2022-12-07 DIAGNOSIS — F1020 Alcohol dependence, uncomplicated: Secondary | ICD-10-CM

## 2022-12-07 DIAGNOSIS — F39 Unspecified mood [affective] disorder: Secondary | ICD-10-CM

## 2022-12-07 NOTE — Progress Notes (Signed)
Daily Group Progress Note   Program: CD IOP     Individual Time: 9 a.m. to 12 p.m.   Type of Therapy: Process and Psychoeducational    Topic: The therapist checks in with group members, assesses for SI/HI/psychosis and overall level of functioning. The therapist inquires about sobriety date and number of community support meetings attended since last session.   The therapist introduces a new group member and answers questions on what is a Marketing executive, what the difference is between AA and NA, and what the Big Book is and how to get one. The therapist reiterates the information from the first module of the Matrix Model concerning the importance of scheduling. He educates group members on the different drinking patterns that persons with alcohol dependence can have. He discusses how engaging in incompatible activities can help one maintain sobriety and challenges group members to come up with coping techniques other than using substances to deal with emotional distress.  The therapist observes that it took about ten minutes for a group member to come up with the suggestion of reaching out to others with all activities identified before this being solitary activities such as meditating, journaling, etcetera. The therapist explains why reaching out to others and connecting is critical to one's being able to maintain recovery.    Summary: Logan Martinez presents rating his depression as a "0" and his anxiety as a "0."    Logan Martinez asks a lot of questions today in group such as how getting a Sponsor works, about the differences between [employer] and NA, what the [employer] is, etcetera. He says that he is wanting to give a meeting a try.  He says that he has the same sobriety date and is happy but tired noting that the Trazodone he has been taking has done nothing for his sleep. He says that he took three, 50 mg Trazodone; however, he can got 24 hours with no sleep at all.   Given his history of going long periods without sleep  while simultaneously remaining "happy," the therapist gives him the MDQ and BSDS to complete to screen for the possibility of a bipolar spectrum disorder.  Logan Martinez says that he is keeping himself busy such that he does not think about drinking asking if this is the right thing to do with the therapist noting that this is congruent with what is taught in the [facility] about the importance of scheduling.   Today, Logan Martinez also notes that he needs to get back in touch with friends with whom he has lost contact in the past year.    Progress Towards Goals: Logan Martinez reports no alcohol use.    UDS collected: No Results: No   AA/NA attended?: No   Sponsor?: No   Myrna Blazer, MA, LCSW, [facility], LCAS 12/07/2022

## 2022-12-08 DIAGNOSIS — D7589 Other specified diseases of blood and blood-forming organs: Secondary | ICD-10-CM | POA: Diagnosis not present

## 2022-12-08 DIAGNOSIS — E7800 Pure hypercholesterolemia, unspecified: Secondary | ICD-10-CM | POA: Diagnosis not present

## 2022-12-08 DIAGNOSIS — F1021 Alcohol dependence, in remission: Secondary | ICD-10-CM | POA: Diagnosis not present

## 2022-12-08 DIAGNOSIS — R7303 Prediabetes: Secondary | ICD-10-CM | POA: Diagnosis not present

## 2022-12-09 ENCOUNTER — Ambulatory Visit (INDEPENDENT_AMBULATORY_CARE_PROVIDER_SITE_OTHER): Payer: BC Managed Care – PPO | Admitting: Licensed Clinical Social Worker

## 2022-12-09 DIAGNOSIS — F1020 Alcohol dependence, uncomplicated: Secondary | ICD-10-CM | POA: Diagnosis not present

## 2022-12-09 DIAGNOSIS — F17200 Nicotine dependence, unspecified, uncomplicated: Secondary | ICD-10-CM

## 2022-12-09 DIAGNOSIS — F39 Unspecified mood [affective] disorder: Secondary | ICD-10-CM

## 2022-12-09 NOTE — Progress Notes (Signed)
Daily Group Progress Note   Program: CD IOP     Individual Time: 9 a.m. to 12 p.m.   Type of Therapy: Process and Psychoeducational    Topic: The therapist checks in with group members, assesses for SI/HI/psychosis and overall level of functioning. The therapist inquires about sobriety date and number of community support meetings attended since last session.    The therapist facilitates discussions on co-dependency and how this relates to addiction and the differences between helping and enabling. The therapist talks about the role of "tough love" in relation to dealing with people with addiction.   The therapist discusses addiction as an inheritable, brain-based disease noting that everyone present in group today has family members with the disease of addiction which is not surprising.    Summary: Logan Martinez presents rating his depression as a "0" and his anxiety as a "0."    Logan Martinez says that he feels "unfocused" and "proud" that he has continued with this group. 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 A group member who previously had sustained sobriety says that he was finally about to read after 60 to 90 days sober with Logan Martinez noting that he cannot focus enough to read.   Logan Martinez has yet to attend a meeting saying that he was going to research meetings first but did not follow through on doing this but will. During the discussion about the inheritability of addiction, the therapist notes that Logan Martinez indicated that he has no family history of addiction with Logan Martinez saying that this is correct; however, the therapist reminds Logan Martinez that he has talked about his brother as also being an alcoholic. Logan Martinez admits that he does not know a lot about his family history very far back so it is  possible other family members had this disease as well.    Progress Towards Goals: Logan Martinez reports no alcohol use.    UDS collected: No Results: Yes, negative for drugs and alcohol.    AA/NA attended?: No   Sponsor?: No   Myrna Blazer, [place], [place], [facility], LCAS 12/09/2022

## 2022-12-12 ENCOUNTER — Telehealth (HOSPITAL_COMMUNITY): Payer: Self-pay | Admitting: Licensed Clinical Social Worker

## 2022-12-12 ENCOUNTER — Encounter (HOSPITAL_COMMUNITY): Payer: Self-pay

## 2022-12-12 ENCOUNTER — Ambulatory Visit (HOSPITAL_COMMUNITY): Payer: BC Managed Care – PPO | Admitting: Licensed Clinical Social Worker

## 2022-12-12 DIAGNOSIS — Z0000 Encounter for general adult medical examination without abnormal findings: Secondary | ICD-10-CM | POA: Diagnosis not present

## 2022-12-12 NOTE — Telephone Encounter (Signed)
The therapist attempts to reach Weston Brass by phone after he no showed for IOP today. The therapist leaves a HIPAA-compliant voicemail.  [address] MA, LCSW, [facility], LCAS 12/12/2022

## 2022-12-13 ENCOUNTER — Telehealth (HOSPITAL_COMMUNITY): Payer: Self-pay | Admitting: Licensed Clinical Social Worker

## 2022-12-13 NOTE — Telephone Encounter (Signed)
Logan Martinez leaves a voicemail saying that he missed group due to having a doctor's appointment that he previously scheduled but will be in group on 12/14/22  Myrna Blazer, MA, LCSW, [facility], LCAS 12/13/2022

## 2022-12-14 ENCOUNTER — Ambulatory Visit (INDEPENDENT_AMBULATORY_CARE_PROVIDER_SITE_OTHER): Payer: BC Managed Care – PPO | Admitting: Licensed Clinical Social Worker

## 2022-12-14 ENCOUNTER — Encounter (HOSPITAL_COMMUNITY): Payer: Self-pay | Admitting: Licensed Clinical Social Worker

## 2022-12-14 DIAGNOSIS — F1020 Alcohol dependence, uncomplicated: Secondary | ICD-10-CM

## 2022-12-14 DIAGNOSIS — F17200 Nicotine dependence, unspecified, uncomplicated: Secondary | ICD-10-CM

## 2022-12-14 DIAGNOSIS — F10282 Alcohol dependence with alcohol-induced sleep disorder: Secondary | ICD-10-CM

## 2022-12-14 DIAGNOSIS — F39 Unspecified mood [affective] disorder: Secondary | ICD-10-CM

## 2022-12-14 DIAGNOSIS — F4321 Adjustment disorder with depressed mood: Secondary | ICD-10-CM

## 2022-12-14 DIAGNOSIS — F10982 Alcohol use, unspecified with alcohol-induced sleep disorder: Secondary | ICD-10-CM

## 2022-12-14 DIAGNOSIS — G629 Polyneuropathy, unspecified: Secondary | ICD-10-CM

## 2022-12-14 DIAGNOSIS — I10 Essential (primary) hypertension: Secondary | ICD-10-CM

## 2022-12-14 NOTE — Progress Notes (Signed)
   [facility] Follow-up Outpatient CDIOP  Date: 12/14/2022  Admission Date:12/05/22  Sobriety date: 11/19/2022  Subjective: " I dont even think about" (alcohol use/desire)  HPI:CD IOP Provider FU Pt seen for initial IOP FU having been absent from Group Monday due to his yearly PCP FU. Says he informed PCP about his CD IOP and drinking history which he admits he had not been honest about in the past.  Counselor's report:Logan Martinez presents rating his depression as a "0" and his anxiety as a "0."  Logan Martinez admits that he would not have come to this program and would still be drinking if not for an ultimatum from his wife.  He still has not attended a meeting and then indicates that he does not see the reason for starting to attend meeting while in this program as it is meeting his needs   Review of Systems:  Psychiatric: Agitation: Denies Hallucination: No Depressed Mood: Yes but much better with Lamictal Insomnia: Rx Trazodone Hypersomnia: No Altered Concentration: No Feels Worthless: No Grandiose Ideas: No Belief In Special Powers: No New/Increased Substance Abuse: No Compulsions: Denies  Neurologic: Headache: No Seizure: No Paresthesias: No  Current Medications:   Vital Signs  Mental Status Examination  Appearance: Alert: Yes Attention: good  Cooperative: Yes Eye Contact: Good Speech: Clear and coherent Psychomotor Activity: Normal Memory Concentration/Attention: Normal/intact Oriented: person, place, time/date and situation Mood: Euthymic Affect: Appropriate and Congruent Thought Processes and Associations: Coherent and Intact Fund of Knowledge: Good Thought Content: WDL Insight: Good Judgement: Good  [id number]  PDMP: Filled  Written  Sold  ID  Drug  QTY  Days  Prescriber         11/04/2022 08/05/2022  1 Gabapentin 300 Mg Capsule 120.00 30 J Pet        09/24/2022 09/20/2022  1 Zolpidem Tartrate 10 Mg Tablet 90.00 90 Li Mil         Diagnosis:   Alcohol use disorder, severe, dependence (HCC) Tobacco use disorder Unspecified mood (affective) disorder (HCC) Peripheral polyneuropathy Essential hypertension Alcohol-induced insomnia (HCC) Unresolved grief  Assessment: Significant denial about his alcoholism  Treatment Plan:Per admission  Informed that there is no record of alcoholics (those individuals in grips of progressive addicted brain disorder) recovering on their own/single handed combat without admitting their condition and getting help   Maryjean Morn, PA-CPatient ID: Logan Martinez, male   DOB: 02/04/1966, 57 y.o.   MRN: [id number]

## 2022-12-14 NOTE — Progress Notes (Signed)
Daily Group Progress Note   Program: CD IOP     Individual Time: 9 a.m. to 12 p.m.   Type of Therapy: Process and Psychoeducational    Topic: The therapist checks in with group members, assesses for SI/HI/psychosis and overall level of functioning. The therapist inquires about sobriety date and number of community support meetings attended since last session.    The therapist reviews the content and exercises covered in the previous group from the video, "Breaking the Addiction Cycle," as two members in attendance today missed group on Monday. The therapist focuses primarily on the topic of denial and the process by which persons actively in addition will "rebalance" the equation such that the positives of using seemingly outweigh the negatives. The therapist facilitates a discussion concerning how working the First Step is not just a mental process but that in doing so, behavioral change is implied. For example, one cannot say that he or she has accept powerlessness over drugs and alcohol while at the same time refusing to give up a stash, continuing to hang out with using friends, etcetera. The therapist explains the reason that group members are encouraged to start attending Twelve Step meetings and to get Sponsors while in CD IOP as opposed to waiting until after discharge.   Summary: Logan Martinez presents rating his depression as a "0" and his anxiety as a "0."    Logan Martinez reports feeling "thankful" and "optimistic." During a discussion of people typically coming to treatment due to external pressures or consequences, Logan Martinez admits that he would not have come to this program and would still be drinking if not for an ultimatum from his wife.   When asked, he says that he still has not attended a meeting and then indicates that he does not see the reason for starting to attend meeting while in this program as it is meeting his needs. Thus, the therapist extensively explains the reasons for starting to attend  AA while in CD IOP versus waiting with another group member showing Logan Martinez how to [employer].    Progress Towards Goals: Logan Martinez reports no alcohol use.    UDS collected: Yes Results: No   AA/NA attended?: No   Sponsor?: No   Myrna Blazer, MA, LCSW, [facility], LCAS 12/14/2022

## 2022-12-16 ENCOUNTER — Ambulatory Visit (INDEPENDENT_AMBULATORY_CARE_PROVIDER_SITE_OTHER): Payer: BC Managed Care – PPO | Admitting: Licensed Clinical Social Worker

## 2022-12-16 DIAGNOSIS — F1020 Alcohol dependence, uncomplicated: Secondary | ICD-10-CM | POA: Diagnosis not present

## 2022-12-16 DIAGNOSIS — F39 Unspecified mood [affective] disorder: Secondary | ICD-10-CM

## 2022-12-16 DIAGNOSIS — F17200 Nicotine dependence, unspecified, uncomplicated: Secondary | ICD-10-CM

## 2022-12-16 NOTE — Progress Notes (Signed)
Daily Group Progress Note   Program: CD IOP     Individual Time: 9 a.m. to 12 p.m.   Type of Therapy: Process and Psychoeducational    Topic: The therapist checks in with group members, assesses for SI/HI/psychosis and overall level of functioning. The therapist inquires about sobriety date and number of community support meetings attended since last session.    The therapist continues to show the video, "Breaking the Addiction Cycle" having group members complete the exercise concerning recounting one particular thing that caused them to see that their life had become unmanageable. The therapist facilitates a discussion on the personal use cycle giving group members the homework of detailing their use rituals.    Summary: Weston Brass presents rating his depression as a "0" and his anxiety as a "0."    Weston Brass reports feeling "joyful" and "confident." He has yet to attend a meeting. During one of the group discussions, the therapist notes another reason that Twelve Step attendance is encouraged for those in CD IOP. The therapist explains that in CD IOP that person learn about the disease of addiction and relapse prevention techniques; however, they are only exposed to other addicts in very early recovery most of whom are struggling emotionally due to PAWS. By attending Twelve Step programs, people in CD IOP are able to come in contact with persons with years of sobriety which for many offers hope as they see that people later in recovery are content with their lives and past PAWS.   Weston Brass is engaged in today's discussion on using rituals and seeking behavior.    Progress Towards Goals: Weston Brass reports no alcohol use.    UDS collected: No Results: No   AA/NA attended?: No   Sponsor?: No   Myrna Blazer, MA, LCSW, [facility], LCAS 12/16/2022

## 2022-12-19 ENCOUNTER — Ambulatory Visit (INDEPENDENT_AMBULATORY_CARE_PROVIDER_SITE_OTHER): Payer: BC Managed Care – PPO | Admitting: Licensed Clinical Social Worker

## 2022-12-19 DIAGNOSIS — E663 Overweight: Secondary | ICD-10-CM | POA: Insufficient documentation

## 2022-12-19 DIAGNOSIS — D7589 Other specified diseases of blood and blood-forming organs: Secondary | ICD-10-CM | POA: Insufficient documentation

## 2022-12-19 DIAGNOSIS — F17200 Nicotine dependence, unspecified, uncomplicated: Secondary | ICD-10-CM

## 2022-12-19 DIAGNOSIS — F1020 Alcohol dependence, uncomplicated: Secondary | ICD-10-CM

## 2022-12-19 DIAGNOSIS — G4700 Insomnia, unspecified: Secondary | ICD-10-CM | POA: Insufficient documentation

## 2022-12-19 DIAGNOSIS — F1021 Alcohol dependence, in remission: Secondary | ICD-10-CM | POA: Insufficient documentation

## 2022-12-19 DIAGNOSIS — R7303 Prediabetes: Secondary | ICD-10-CM | POA: Insufficient documentation

## 2022-12-19 DIAGNOSIS — E538 Deficiency of other specified B group vitamins: Secondary | ICD-10-CM | POA: Insufficient documentation

## 2022-12-19 DIAGNOSIS — G629 Polyneuropathy, unspecified: Secondary | ICD-10-CM | POA: Insufficient documentation

## 2022-12-19 DIAGNOSIS — R748 Abnormal levels of other serum enzymes: Secondary | ICD-10-CM | POA: Insufficient documentation

## 2022-12-19 DIAGNOSIS — K760 Fatty (change of) liver, not elsewhere classified: Secondary | ICD-10-CM | POA: Insufficient documentation

## 2022-12-19 DIAGNOSIS — N400 Enlarged prostate without lower urinary tract symptoms: Secondary | ICD-10-CM | POA: Insufficient documentation

## 2022-12-19 DIAGNOSIS — E7800 Pure hypercholesterolemia, unspecified: Secondary | ICD-10-CM | POA: Insufficient documentation

## 2022-12-19 DIAGNOSIS — I10 Essential (primary) hypertension: Secondary | ICD-10-CM | POA: Insufficient documentation

## 2022-12-19 DIAGNOSIS — F39 Unspecified mood [affective] disorder: Secondary | ICD-10-CM

## 2022-12-19 NOTE — Progress Notes (Signed)
Daily Group Progress Note   Program: CD IOP     Individual Time: 9 a.m. to 12 p.m.   Type of Therapy: Process and Psychoeducational    Topic: The therapist checks in with group members, assesses for SI/HI/psychosis and overall level of functioning. The therapist inquires about sobriety date and number of community support meetings attended since last session.    The therapist has group members review their homework from last group in which they were to describe their using rituals and he shows the last portion of the video, "Breaking the Addiction Cycle" focusing on things people can do to avoid and interrupt these rituals in a way that reminds them that they are in recovery and no longer in active addiction. The therapist observes that some members have delayed in getting a Sponsor and attending meetings though apparently having no explanation as to why. The therapist notes that there is always a reason that people do or do not do things per behavioral therapy which states, "all behavior is purposeful;" however, people are not always aware of or willing to admit these reasons to themselves and/or others.     Summary: Logan Martinez presents rating his depression as a "0" and his anxiety as a "0."    He describes his mood as being "peaceful" and "tired" noting that he slept poorly last night. He shares his using ritual in which he would pick up alcohol at 7 a.m. on the way to work and drink buying a bottle of [employer]. He says that he went to the same store each morning such that the guy would ring him up before he got to the counter knowing what he was going to buy.   Logan Martinez says that he hid alcohol from his wife and that while he was in detox that she went through the entire house getting rid of his stashed bottles with Logan Martinez saying it was a good thing as he would likely drink them if he found them. He says that he will now take the long way around to avoid the store where he used to buy alcohol.  He  says that he has the same date and when asked about AA says that he is "not ready for it." He admits that he has nothing to do after IOP today with two other members who are going to a meeting right afterwards inviting him to go; however, Logan Martinez declines. When the therapist asks what the reason is that he is not ready, he responds, "I don't know" so is given the homework to meditate on this question and to return to the next group with the answer.    Progress Towards Goals: Logan Martinez reports no alcohol use.    UDS collected: Yes Results: No   AA/NA attended?: No   Sponsor?: No   Myrna Blazer, MA, LCSW, [facility], LCAS 12/19/2022

## 2022-12-21 ENCOUNTER — Ambulatory Visit (INDEPENDENT_AMBULATORY_CARE_PROVIDER_SITE_OTHER): Payer: BC Managed Care – PPO | Admitting: Licensed Clinical Social Worker

## 2022-12-21 ENCOUNTER — Encounter (HOSPITAL_COMMUNITY): Payer: Self-pay | Admitting: Licensed Clinical Social Worker

## 2022-12-21 DIAGNOSIS — F1020 Alcohol dependence, uncomplicated: Secondary | ICD-10-CM | POA: Diagnosis not present

## 2022-12-21 DIAGNOSIS — F17200 Nicotine dependence, unspecified, uncomplicated: Secondary | ICD-10-CM

## 2022-12-21 DIAGNOSIS — F39 Unspecified mood [affective] disorder: Secondary | ICD-10-CM

## 2022-12-21 NOTE — Progress Notes (Signed)
Daily Group Progress Note   Program: CD IOP     Individual Time: 9 a.m. to 12 p.m.   Type of Therapy: Process and Psychoeducational    Topic: The therapist checks in with group members, assesses for SI/HI/psychosis and overall level of functioning. The therapist inquires about sobriety date and number of community support meetings attended since last session.    The therapist presents information on and facilitates discussion about a number of topics today. He defines for group members what a standard drink is and what constitutes a binge episode for a male and for a male based on the number of standard drinks consumed. The therapist notes that it is not uncommon that people with addiction or alcoholism have friends and partners who are actively in addiction; however, while they may come to recognize it in themselves, they are often blind to it in those with whom they associate. The therapist shows a video on the three stages of relapse emphasizing that emotional relapse equates with poor self-care. The therapist explains what good self-care looks like both from a cognitive and a behavioral standpoint. He educates group members on caffeine intoxication and how keeping caffeine consumption in moderation and stopping after noon constitutes self-care. He educates group members on the old, AA suggestion about eating a piece of candy when one is trying to curb a craving to drink and how many people have severe sugar cravings early in their sobriety. The therapist notes that people in recovery can reach out to their non-addict friends and loved ones; however, the added benefit of reaching out to other addicts in recovery is that they are more able to understand what they are going through. He educates the group on the concept of relationship addiction. He also discusses the importance of paying attention to one's emotions or feelings in addition to one's thoughts. He states his opinion that people will share  their thoughts with acquaintances; however, will only share their feelings with people they trust as doing so involves a deeper level of intimacy. The therapist explains the reason that people in active addiction remain emotionally immature and how a person in a recovery quickly starts to outgrow this old relationships. Lastly, the therapist educates group members on the use of light therapist in relation to Seasonal Affective Disorder.    Summary: Weston Brass presents rating his depression as a "0" and his anxiety as a "0."    He describes his mood as being "hopeful" and "successful." He says that he has the same sobriety date and notes that he recently achieved his goal of reconnecting with friends with whom he lost contact. When asked about these friends' drinking, Weston Brass says that his friends all drink socially; however, he later shares that he has declined to attend their annual fishing trip as all of them apparently drink to intoxication. Thus, the therapist questions if these friends may possibly not be social drinkers.  Weston Brass says that he spoke with his best friend saying that if he has a desire to drink in the future that he may need to "talk" Weston Brass "down." Another group member shares his experience in the past with reaching out to a friend who does not have the disease of addiction, illustrating how this friend was well-meaning but was not as helpful as people in [place] as he did not understand the disease of addiction.   Weston Brass says that he did his homework concerning the reason that he has not attended AA reiterating that he plans on  attending after he completes CD IOP; however, does not need it now as CD IOP meets his need. The therapist again explains the reason that attending before completing CD IOP is recommended with Weston Brass agreeing to attend a meeting to check it out with other group members also expressing their willingness to attend with him. Weston Brass asks another group member about her experience with AA as  she too previously expressed a lack of enthusiasm about attending but recently not only attended a meeting but shared at the meeting.    Progress Towards Goals: Weston Brass reports no alcohol use.    UDS collected: No Results: No   AA/NA attended?: No   Sponsor?: No   Myrna Blazer, MA, LCSW, [facility], LCAS 12/21/2022

## 2022-12-23 ENCOUNTER — Encounter (HOSPITAL_COMMUNITY): Payer: Self-pay | Admitting: Medical

## 2022-12-23 ENCOUNTER — Ambulatory Visit (INDEPENDENT_AMBULATORY_CARE_PROVIDER_SITE_OTHER): Payer: BC Managed Care – PPO | Admitting: Licensed Clinical Social Worker

## 2022-12-23 DIAGNOSIS — D7589 Other specified diseases of blood and blood-forming organs: Secondary | ICD-10-CM

## 2022-12-23 DIAGNOSIS — F17200 Nicotine dependence, unspecified, uncomplicated: Secondary | ICD-10-CM

## 2022-12-23 DIAGNOSIS — F10282 Alcohol dependence with alcohol-induced sleep disorder: Secondary | ICD-10-CM

## 2022-12-23 DIAGNOSIS — G609 Hereditary and idiopathic neuropathy, unspecified: Secondary | ICD-10-CM

## 2022-12-23 DIAGNOSIS — K7010 Alcoholic hepatitis without ascites: Secondary | ICD-10-CM

## 2022-12-23 DIAGNOSIS — F1020 Alcohol dependence, uncomplicated: Secondary | ICD-10-CM

## 2022-12-23 DIAGNOSIS — I10 Essential (primary) hypertension: Secondary | ICD-10-CM

## 2022-12-23 DIAGNOSIS — E1142 Type 2 diabetes mellitus with diabetic polyneuropathy: Secondary | ICD-10-CM

## 2022-12-23 DIAGNOSIS — E7800 Pure hypercholesterolemia, unspecified: Secondary | ICD-10-CM

## 2022-12-23 DIAGNOSIS — F10982 Alcohol use, unspecified with alcohol-induced sleep disorder: Secondary | ICD-10-CM

## 2022-12-23 DIAGNOSIS — F39 Unspecified mood [affective] disorder: Secondary | ICD-10-CM

## 2022-12-23 DIAGNOSIS — F4321 Adjustment disorder with depressed mood: Secondary | ICD-10-CM

## 2022-12-23 DIAGNOSIS — G629 Polyneuropathy, unspecified: Secondary | ICD-10-CM

## 2022-12-23 NOTE — Progress Notes (Signed)
   [facility] Follow-up Outpatient CDIOP Date: 12/23/2022  Admission Date:12/02/2022  Sobriety date:11/19/2022  Subjective: "I know (the time will come when I will be triggered)"  HPI : CD IOP Provider FU  Counselor's report:[name] presents rating his depression as a "0" and his anxiety as a "0."   Review of Systems: Psychiatric: Agitation: No Hallucination: No Depressed Mood: Denies any Insomnia: No Hypersomnia: No Altered Concentration: No Feels Worthless: No Grandiose Ideas: No Belief In Special Powers: No New/Increased Substance Abuse: No Compulsions: Denies  Neurologic: Headache: No Seizure: No Paresthesias: No  Current Medications: atorvastatin 40 MG tablet Commonly known as: LIPITOR Take 40 mg by mouth daily.  gabapentin 300 MG capsule Commonly known as: NEURONTIN Take 1 capsule (300 mg total) by mouth 3 (three) times daily.  lamoTRIgine 100 MG tablet Commonly known as: LaMICtal Take 1 tablet (100 mg total) by mouth daily.  metoprolol succinate 50 MG 24 hr tablet Commonly known as: TOPROL-XL Take 50 mg by mouth daily.     Mental Status Examination  Appearance:Casual/neat Alert: Yes Attention: good  Cooperative: Yes Eye Contact: Good Speech: Clear and coherent Psychomotor Activity: Normal Memory: Counselor reports "[place]" Concentration/Attention: Normal/intact Oriented: person, place, time/date and situation Mood: Euthymic Affect: Appropriate and Congruent Thought Processes and Associations: Coherent and Intact Fund of Knowledge: WDL Thought Content: No SI/HI  Insight: Good Judgement: Good  UDS:4/49&5/13 2024 Clear  PDMP:Gabapentin rx  Diagnosis:  Alcohol use disorder, severe, dependence (HCC) Tobacco use disorder Unspecified mood (affective) disorder (HCC) Peripheral polyneuropathy Essential hypertension Alcohol-induced insomnia (HCC) Unresolved grief Idiopathic peripheral neuropathy Hypercholesteremia Type 2 diabetes  mellitus with diabetic polyneuropathy, without long-term current use of insulin (HCC) Alcoholic hepatitis without ascites Macrocytosis  Assessment:Pt resisting AA but denies any problems with alcohol so far Liver disease  and PN ETOH etiology stressed along with the severity of his dependence and its fatal outcome unless he abstains  Treatment Plan:Per admission Maryjean Morn, PA-CPatient ID: Philis Nettle, male   DOB: 08-27-1965, 57 y.o.   MRN: [id number]

## 2022-12-23 NOTE — Progress Notes (Signed)
Daily Group Progress Note   Program: CD IOP     Individual Time: 9 a.m. to 12 p.m.   Type of Therapy: Process and Psychoeducational    Topic: The therapist checks in with group members, assesses for SI/HI/psychosis and overall level of functioning. The therapist inquires about sobriety date and number of community support meetings attended since last session.   The therapist facilitates discussions on the differences between helping and enabling, the reason that the Twelve Step program is one of attraction and not problem, why to avoid the "rescue fantasy," and the importance of being assertive and being able to ask for what one needs in relation to recovery. The therapist answers questions regarding the possible reasons a person could be involuntarily discharged from CD IOP.   The therapist says that the key takeaway for today's meeting should be that being sober is not enough. The therapist discusses how being counter-dependent is not less unhealthy than being dependent and that healthy is somewhere between the middle of the two extremes.    Summary: Weston Brass presents rating his depression as a "0" and his anxiety as a "0."    He describes his mood as "joyful" and "content" noting that he keeps himself constantly busy such that he cannot drink and reiterates that he feels good about connecting with old friends. He tells another group member that he believes his anti-depressant made the different. This group member asks how long it has been since Weston Brass started drinking heavily. Weston Brass says that he last feel like he does not 6-7 years ago. The therapist inquires if Weston Brass drank back then with Weston Brass minimizing his drinking. The therapist has an extremely hard time eliciting specifically how much and how often Weston Brass was drinking at this time with Weston Brass finally saying that it was every other day. He then claims to not know how much at all he was drinking but eventually says that it was probably two drinks with  Scotch on average.   Weston Brass tells the therapist that he plans on proving this therapist wrong about PAWS. The therapist explains that if Weston Brass were to somehow escape PAWS altogether that it would not be as result of any action on his part but rather a biological anomaly.   Weston Brass has still not attended an AA meeting reiterating that his plan is to attend at the conclusion of CD IOP. He says that he plans on attending a meeting as suggested by the therapist asking the therapist to let him know when he is down to his last one or two groups. The therapist once more explains that Weston Brass was to attend a meeting now and reminds Weston Brass and others that this expectation was covered during all their initial assessments. Other group members once more explain the benefits of starting AA during CD IOP versus after.   When another group member talks about her inability to tell her Sponsor that she wants to change to another Sponsor who attends the same meetings with her previous Sponsor, Weston Brass notes that he too would have a problem doing this as he has a hard time asserting himself. The therapist points to this as a perfect example of why it is important to start meetings while in CD IOP as this member is able to get feedback and guidance concerning how to navigate this.    Progress Towards Goals: Weston Brass reports no alcohol use.    UDS collected: No Results: Negative for drugs and alcohol   AA/NA attended?: No  Sponsor?: No   Myrna Blazer, [place], [employer], [facility], LCAS 12/23/2022

## 2022-12-26 ENCOUNTER — Ambulatory Visit (INDEPENDENT_AMBULATORY_CARE_PROVIDER_SITE_OTHER): Payer: BC Managed Care – PPO | Admitting: Licensed Clinical Social Worker

## 2022-12-26 ENCOUNTER — Encounter (HOSPITAL_COMMUNITY): Payer: Self-pay | Admitting: Licensed Clinical Social Worker

## 2022-12-26 DIAGNOSIS — F17200 Nicotine dependence, unspecified, uncomplicated: Secondary | ICD-10-CM

## 2022-12-26 DIAGNOSIS — F1020 Alcohol dependence, uncomplicated: Secondary | ICD-10-CM | POA: Diagnosis not present

## 2022-12-26 NOTE — Progress Notes (Signed)
Daily Group Progress Note   Program: CD IOP     Individual Time: 9 a.m. to 12 p.m.   Type of Therapy: Process and Psychoeducational    Topic: The therapist checks in with group members, assesses for SI/HI/psychosis and overall level of functioning. The therapist inquires about sobriety date and number of community support meetings attended since last session.   The therapist discusses the differences between assertive, aggressive, passive, and passive-aggressive responses and explains how being assertive relates to recovery from addiction. The therapist encourages limit setting in relation to avoiding people, places, and things.    Summary: Logan Martinez presents rating his depression as a "0" and his anxiety as a "0."    He describes his mood as "calm" and "happy." He did not attend any meetings over the weekend saying that his wife "had stuff planned" for them to do. He asks if others in group have had a DWI and they answer his questions regarding getting his license back after having a limited privilege.   Logan Martinez informs the therapist that he is calling his brother each week and telling him about what going on in group asking if this is the "rescue fantasy." His brother has severe alcohol problems and has had withdrawal seizures in the past biting his tongue on one occasion. The therapist elicits feedback from the group with a couple of group members indicating that Jkwon should not say anything to his brother but just be an example of what sobriety looks like. The therapist suggests that Logan Martinez is giving his brother unsolicited feedback.  Additionally, the therapist observes that he wants his brother to stop drinking; however, when Logan Martinez recently had a cookout both his brother and one of Logan Martinez's friends brought beer that they kept in their car. When Logan Martinez indicates that he only saw both drink a couple of beers other group members point out that they could have drunk before coming to the party or were going to  their car having more than one drink. The therapist notes that Logan Martinez was not being smart versus being strong. He points out that Logan Martinez's believing he is safe as he no longer thinks about alcohol illustrates a lack of awareness of what he is facing. The therapist and others point out that with only 5 weeks of sobriety that Logan Martinez is in effect a "wet behind the ears newcomer."  The therapist asks to schedule an individual meeting with Logan Martinez which will take place on 12/29/22.    Progress Towards Goals: Logan Martinez reports no alcohol use.    UDS collected: Yes Results: No   AA/NA attended?: No   Sponsor?: No   Myrna Blazer, MA, LCSW, [facility], LCAS 12/26/2022

## 2022-12-28 ENCOUNTER — Ambulatory Visit (HOSPITAL_COMMUNITY): Payer: BC Managed Care – PPO | Admitting: Licensed Clinical Social Worker

## 2022-12-28 DIAGNOSIS — F17200 Nicotine dependence, unspecified, uncomplicated: Secondary | ICD-10-CM

## 2022-12-28 DIAGNOSIS — F1020 Alcohol dependence, uncomplicated: Secondary | ICD-10-CM

## 2022-12-28 NOTE — Progress Notes (Signed)
Daily Group Progress Note   Program: CD IOP     Individual Time: 9 a.m. to 12 p.m.   Type of Therapy: Process and Psychoeducational    Topic: The therapist checks in with group members, assesses for SI/HI/psychosis and overall level of functioning. The therapist inquires about sobriety date and number of community support meetings attended since last session.    The therapist facilitates a group discussion on the Assertive Bill of Rights and talks about how relationships can change as a result of a person being in recovery. The therapist notes that not everyone will like the new-and-improved version such that relationships can be lost while new, healthier ones will be gained. The therapist talks about the importance of family members and loved ones who do not have the disease of addiction being educated about it such that they know how best to support a person in recovery. The therapist, again, explains the benefits of attending problems like AA and NA as this allows people in recovery to interact with others who understand what they are going through. The therapist discusses the different types of meetings and ask group members who attend which type of meetings they like the most and the least.   Group concludes with a discussion regarding what is meant by saying that one is "stuck in self-will" and how being in this state prevents people from humbly taking and following the advice of trained professionals and those with decades of recovery.    Summary: Logan Martinez presents rating his depression as a "0" and his anxiety as a "0."    He describes his mood as "joyful" and "energetic." He is very involved in the discussion of the assertive bill of rights taking issue with item #3; however, once another group member provides a different interpretation of it, he is in agreement with it.   In response to Logan Martinez and another group member's tendency to be passive in an effort to not hurt others feelings, the  therapist illustrates the pitfalls of doing so pointing out that the passivity often ironically leads to the outcome they want to avoid in causing the other party distress.  Logan Martinez says that he talked to his wife about AA and informs the group that he is attending his first meeting tonight virtually asking a lot of questions apparently being unaware that there are different types of meetings. The therapist informs Logan Martinez that his wife is allowed to attend open meetings which him if she chooses.    Progress Towards Goals: Logan Martinez reports no alcohol use.    UDS collected: No Results: No   AA/NA attended?: No   Sponsor?: No   Myrna Blazer, MA, LCSW, [facility], LCAS 12/28/2022

## 2022-12-29 ENCOUNTER — Ambulatory Visit (INDEPENDENT_AMBULATORY_CARE_PROVIDER_SITE_OTHER): Payer: BC Managed Care – PPO | Admitting: Licensed Clinical Social Worker

## 2022-12-29 DIAGNOSIS — F1020 Alcohol dependence, uncomplicated: Secondary | ICD-10-CM

## 2022-12-29 DIAGNOSIS — F17200 Nicotine dependence, unspecified, uncomplicated: Secondary | ICD-10-CM

## 2022-12-29 NOTE — Progress Notes (Signed)
Daily Group Progress Note   Program: CD IOP     Individual Time: 9 a.m. to 12 p.m.   Type of Therapy: Motivational Interviewing and Psychoeducational  The therapist meets with Logan Martinez and discusses the on-line speaker meeting he attended last night and continues to educate him about how Twelve Step meetings work, the benefits of attending Twelve Step meetings, and the benefits of having CD IOP to discuss issues and concerns related to meetings. The therapist addresses [name]'s reservations about the Higher Power concept in AA with Logan Martinez noting that he could see how he could make the Fellowship his Higher Power.  The therapist addresses [name]'s history of drinking in solitude and his anxiety in interacting with "strangers." The therapist observes that [name]'s friends were strangers before they were not.  He questions Logan Martinez regarding whether or not he needs to stay in CD IOP; and if so, how he will know when he is ready to end group. Logan Martinez admits that he is in CD IOP due to pressure from his wife saying that she initially wanted him to go to [facility]; however, he did not want to go inpatient so agreed to CD IOP.  Logan Martinez says that he does not believe he is ready to stop group; however, he is unable to define what the ending line will look like. He does say that his sleep is improving but is not where he wants it to be yet.  He is given the homework of being more clear on his goal for CD IOP and specifically how he will know he has reached that goal.     Progress Towards Goals: Logan Martinez reports no alcohol use.    UDS collected: No Results: No   AA/NA attended?: Yes   Sponsor?: No   Myrna Blazer, MA, LCSW, [facility], LCAS 12/29/2022

## 2022-12-30 ENCOUNTER — Ambulatory Visit (HOSPITAL_COMMUNITY): Payer: BC Managed Care – PPO | Admitting: Licensed Clinical Social Worker

## 2022-12-30 DIAGNOSIS — F1020 Alcohol dependence, uncomplicated: Secondary | ICD-10-CM

## 2022-12-30 DIAGNOSIS — F17200 Nicotine dependence, unspecified, uncomplicated: Secondary | ICD-10-CM

## 2022-12-30 NOTE — Progress Notes (Signed)
Daily Group Progress Note   Program: CD IOP     Individual Time: 9 a.m. to 12 p.m.   Type of Therapy: Process and Psychoeducational    Topic: The therapist checks in with group members, assesses for SI/HI/psychosis and overall level of functioning. The therapist inquires about sobriety date and number of community support meetings attended since last session.    The therapist shows the video, "The Brain and Recovery: An Update on the Neuroscience of Addiction," pausing the video at various points to expound on the information presented and to answer group members' questions.    Summary: Logan Martinez presents rating his depression as a "0" and his anxiety as a "0."    He describes his mood as "excited" and "confident." He has attended a virtual meeting saying that he plans on attending another one today. Logan Martinez is very engaged in today's topic asking numerous questions and expressing extreme surprise when the therapist informs the group that they have run out of time. He is overheard telling another group member how much he looks forward to coming to group as he walks down the hallway leaving group.  Interestingly, in a discussion of siblings, Logan Martinez notes that his youngest sister was out-of-control when younger using drugs but that she now has three kids and has settled down. Logan Martinez initially told this therapist that he had no family history of addiction; however, he has a brother actively in addiction and a sister who previously had problems with drugs.   The therapist talks to Logan Martinez about his continued use of smokeless tobacco with Logan Martinez saying that his wife has told him that she wants him to quit but he has told her "one thing at a time." The therapist and the video illustrate that one can quit nicotine and other substances concurrently and how nicotine use negatively impacts long-term sobriety from alcohol.    Progress Towards Goals: Logan Martinez reports no alcohol use.    UDS collected: No Results: Yes, negative  for drugs and alcohol but positive for nicotine   AA/NA attended?: Yes   Sponsor?: No   Myrna Blazer, MA, LCSW, [facility], LCAS 12/30/2022

## 2023-01-04 ENCOUNTER — Ambulatory Visit (INDEPENDENT_AMBULATORY_CARE_PROVIDER_SITE_OTHER): Payer: BC Managed Care – PPO | Admitting: Licensed Clinical Social Worker

## 2023-01-04 ENCOUNTER — Other Ambulatory Visit (HOSPITAL_COMMUNITY): Payer: Self-pay | Admitting: Medical

## 2023-01-04 DIAGNOSIS — F17200 Nicotine dependence, unspecified, uncomplicated: Secondary | ICD-10-CM

## 2023-01-04 DIAGNOSIS — F1020 Alcohol dependence, uncomplicated: Secondary | ICD-10-CM | POA: Diagnosis not present

## 2023-01-04 MED ORDER — LAMOTRIGINE 100 MG PO TABS: 100.0000 mg | ORAL_TABLET | Freq: Every day | ORAL | 0 refills | Status: AC

## 2023-01-04 NOTE — Progress Notes (Signed)
Patient ID: Logan Martinez, male   DOB: 11-25-1965, 57 y.o.   MRN: [id number] Pt reports low on Lamictal

## 2023-01-04 NOTE — Progress Notes (Signed)
Daily Group Progress Note   Program: CD IOP     Individual Time: 9 a.m. to 12 p.m.   Type of Therapy: Process and Psychoeducational    Topic: The therapist checks in with group members, assesses for SI/HI/psychosis and overall level of functioning. The therapist inquires about sobriety date and number of community support meetings attended since last session.    The therapist introduces a new group member and educates group members on Delta-8, Kratom, and other legal but elicit substances that should be avoided. He talks about how the same principles that work in quitting drugs and alcohol i.e. avoiding people, places, and things can be applied to quitting nicotine products. The therapist again discusses the importance of learning how to be assertive in recovery so as to set limits and be able to tell others what one wants or needs. He notes that everyone with addiction definitely will hit a bottom; however, unfortunately, for some, this will be death. He explains that in latter stages of addiction that people hit a point of no return in which they can stop and have no permanent damage; however, if they continue will cause irreversible damage. The therapist validates that many people's mood symptoms are substance-induced; however, points out that there are people who are dually diagnoses and thus must have both conditions treated concurrently for treatment to be successful.       Summary: Weston Brass presents rating his depression as a "0" and his anxiety as a "0."    He describes his mood as "peaceful" and "joyful." He says that he attended another virtual meeting noting that he "really enjoyed it." He has thus far attended three virtual meetings only not liking the hybrid meeting as the camera was placed too far from the meeting and the audio was poor. The therapist questions what he liked so much about this meeting with Weston Brass noting that one guy spoke most of the time but that his story of recovery was  amazing providing a brief synopsis of the story. Weston Brass asks this therapist and group members if a Sponsor only has one Sponsee and receives answers to this question.  He says that he woke up this morning with no "internal dialogue" in his head noting that he normally wakes up with "five things" going in his head at one time. He says that drinking "crossed" his mind when he was watching a war movie in which the soldiers were given alcohol after being told the war was over.   When he makes mention of "only" attending virtual meetings, other group members inform him that virtual meetings are meetings and that they too attend virtual meetings as well.    Progress Towards Goals: Weston Brass reports no alcohol use.    UDS collected: Yes Results: No   AA/NA attended?: Yes   Sponsor?: No   Myrna Blazer, MA, LCSW, [facility], LCAS 01/04/2023

## 2023-01-06 ENCOUNTER — Ambulatory Visit (INDEPENDENT_AMBULATORY_CARE_PROVIDER_SITE_OTHER): Payer: BC Managed Care – PPO | Admitting: Licensed Clinical Social Worker

## 2023-01-06 DIAGNOSIS — F17200 Nicotine dependence, unspecified, uncomplicated: Secondary | ICD-10-CM

## 2023-01-06 DIAGNOSIS — F1020 Alcohol dependence, uncomplicated: Secondary | ICD-10-CM | POA: Diagnosis not present

## 2023-01-06 DIAGNOSIS — F39 Unspecified mood [affective] disorder: Secondary | ICD-10-CM

## 2023-01-06 NOTE — Progress Notes (Signed)
Daily Group Progress Note   Program: CD IOP     Individual Time: 9 a.m. to 12 p.m.   Type of Therapy: Process and Psychoeducational    Topic: The therapist checks in with group members, assesses for SI/HI/psychosis and overall level of functioning. The therapist inquires about sobriety date and number of community support meetings attended since last session.    The therapist spends a great deal of time emphasizing the importance of group members staying committed to substance use treatment and meetings until they have at least one year of sobriety noting the pitfalls of stopping sooner than this. He also educates group members about how long they should stay on anti-depressant medication based on the number of episodes of clinical depression they have had in their life times.   He shows the remainder of the video on the neuroscience of addiction by Dr. Monico Hoar emphasizing the symptoms of loss of control and craving as being diagnostic for substance use disorders and how people can be in denial about not having an addiction as a result of being abel to choose to not use when in Court or at work while still having cravings to use. He explains that it is not normative for the majority of the population to turn to drugs and alcohol through a difficult life event such as a divorce or increase their alcohol use in response to this. He notes that these individuals do develop mental health symptoms; however, this does not automatically cause substance use.    Summary: Logan Martinez presents rating his depression as a "0" and his anxiety as a "0."    He describes his mood as "happy" and "proud" and says that he attended "half a meeting" as he got in the meeting late. He says that it was a speaker meeting and that he was glad that he got in late as the speaker was horrible. Another group member who also attended this same meeting validates Logan Martinez's assessment of the meeting. Logan Martinez has not attended an in person  meeting yet but is agreeable to doing so when this other member offers to go with him. Logan Martinez says that if he does not have to go by himself that he is likely to.  Logan Martinez says that he plans on sticking with meetings and treatment after IOP is done reporting that things in his marriage or going well. He says that he has been with his wife since high school and does not want to lose this relationship.    Progress Towards Goals: Logan Martinez reports no alcohol use.    UDS collected: No Results: No   AA/NA attended?: Yes   Sponsor?: No   Myrna Blazer, MA, LCSW, [facility], LCAS 01/06/2023

## 2023-01-09 ENCOUNTER — Ambulatory Visit (INDEPENDENT_AMBULATORY_CARE_PROVIDER_SITE_OTHER): Payer: BC Managed Care – PPO | Admitting: Licensed Clinical Social Worker

## 2023-01-09 DIAGNOSIS — F17200 Nicotine dependence, unspecified, uncomplicated: Secondary | ICD-10-CM

## 2023-01-09 DIAGNOSIS — F1020 Alcohol dependence, uncomplicated: Secondary | ICD-10-CM | POA: Diagnosis not present

## 2023-01-09 NOTE — Progress Notes (Signed)
Daily Group Progress Note   Program: CD IOP     Individual Time: 9 a.m. to 12 p.m.   Type of Therapy: Process and Psychoeducational    Topic: The therapist checks in with group members, assesses for SI/HI/psychosis and overall level of functioning. The therapist inquires about sobriety date and number of community support meetings attended since last session.    The therapist introduces group members to the concept of Stages of Change in addiction and answers questions about the various treatment options and why detox is not indicated for persons using cocaine only or opioids only. He discusses the importance of scheduling meetings and planning life around one's recovery and not recovery around one's life if one is to put recovery first as it should be especially in early recovery. He emphasizes the importance of avoiding triggers i.e. people, places, and things and the challenges involved when one of the people is a close family member.   He questions the reason that the Twelve Step program recommends people with less than a year of sobriety not in relationships to preferably avoid starting one. He discusses how if people are only doing things that they want to do in early recovery that they are likely not in recovery as recovery will ask people to do things that they do not want or feel like doing. The therapist validates that other acceptable options to AA or NA are programs like [facility] Recovery or Celebrate Recovery; however, these other programs do not have the volume and availability of meetings as do AA and NA. The therapist indicates that there is nothing wrong with starting with virtual meetings with one's camera and microphone off and just listening.    Summary: Weston Brass presents rating his depression as a "0" and his anxiety as a "0."    He describes his mood as "sleepy" and "peaceful." He says that he did not attend a meeting this weekend as he was "too busy." The therapist talks about the  importance of scheduling in relation to meetings and suggests that putting recovery first involves scheduling other life events around meets versus scheduling meetings around other life events.   Weston Brass says that he feels tired as he did not sleep well getting only about 5 hours of sleep but then saying that this is good for him. He says that his Trazodone is "hit or miss" in that it works some nights and not others. He says that he took 200 mg once versus 150 mg and was too groggy the next day but took 150 mg thirty minutes before going to sleep and apparently another 50 mg later. The therapist recommends that Weston Brass talk to the PA-C about this noting that the PA-C may want to increase his Trazodone such that it works every night versus hit or miss. He also informs Weston Brass that figuring out the right time to take it for him is important in that he wants to take it early enough to not have a hung over feeling the next day and such that it is taking effect just when he is ready to fall asleep for the night which for Weston Brass is 10 p.m.  He says that he went to his brother's to help him with something saying that he does not believe that his brother has drunk in the past week or at least has not appeared under the influence when he has interacted with Weston Brass. The therapist suggests that what to do when one has family members actively in addiction  is a complex issue for another group.   When Weston Brass talks about how he will replay things from the day in his mind when he lies down at night, the therapist recommends that Weston Brass pay attention to what these things are as they may be things in his life to which he needs to attend.    Progress Towards Goals: Weston Brass reports no alcohol use.    UDS collected: No Results: No   AA/NA attended?: No   Sponsor?: No   Myrna Blazer, MA, LCSW, [facility], LCAS 01/09/2023

## 2023-01-11 ENCOUNTER — Ambulatory Visit (INDEPENDENT_AMBULATORY_CARE_PROVIDER_SITE_OTHER): Payer: BC Managed Care – PPO | Admitting: Licensed Clinical Social Worker

## 2023-01-11 ENCOUNTER — Encounter (HOSPITAL_COMMUNITY): Payer: Self-pay | Admitting: Licensed Clinical Social Worker

## 2023-01-11 DIAGNOSIS — F17200 Nicotine dependence, unspecified, uncomplicated: Secondary | ICD-10-CM

## 2023-01-11 DIAGNOSIS — G629 Polyneuropathy, unspecified: Secondary | ICD-10-CM

## 2023-01-11 DIAGNOSIS — I10 Essential (primary) hypertension: Secondary | ICD-10-CM

## 2023-01-11 DIAGNOSIS — F10282 Alcohol dependence with alcohol-induced sleep disorder: Secondary | ICD-10-CM

## 2023-01-11 DIAGNOSIS — F1020 Alcohol dependence, uncomplicated: Secondary | ICD-10-CM

## 2023-01-11 DIAGNOSIS — K700 Alcoholic fatty liver: Secondary | ICD-10-CM

## 2023-01-11 DIAGNOSIS — F39 Unspecified mood [affective] disorder: Secondary | ICD-10-CM

## 2023-01-11 DIAGNOSIS — E1142 Type 2 diabetes mellitus with diabetic polyneuropathy: Secondary | ICD-10-CM

## 2023-01-11 DIAGNOSIS — E7800 Pure hypercholesterolemia, unspecified: Secondary | ICD-10-CM

## 2023-01-11 DIAGNOSIS — F4321 Adjustment disorder with depressed mood: Secondary | ICD-10-CM

## 2023-01-11 DIAGNOSIS — F10982 Alcohol use, unspecified with alcohol-induced sleep disorder: Secondary | ICD-10-CM

## 2023-01-11 DIAGNOSIS — G609 Hereditary and idiopathic neuropathy, unspecified: Secondary | ICD-10-CM

## 2023-01-11 DIAGNOSIS — D7589 Other specified diseases of blood and blood-forming organs: Secondary | ICD-10-CM

## 2023-01-11 MED ORDER — GABAPENTIN 300 MG PO CAPS: 300.0000 mg | ORAL_CAPSULE | Freq: Four times a day (QID) | ORAL | 2 refills | Status: AC

## 2023-01-11 NOTE — Progress Notes (Signed)
   [facility] Follow-up Outpatient CDIOP Date: 01/11/2023  Admission Date:12/02/2022  Sobriety date:11/19/2022  Subjective: "I'm still on the "Pink Cloud"  HPI : CD IOP Provider FU Pt is seen for in treatment FU now 6 weeks post admission and nearly 8 weeks since his last drink.He has become more compliant with Counseling program ie beginning to attend AA meetings. He wonders about his PN and its relation to alcohol. He says he is low on Gabapentin and that he has noticed an improvement since he in creased to 4 times a day with an HS dose. Renewal rx was at old TID by outside provider so he is running out.   Counselor's report: Weston Brass presents rating his depression as a "0" and his anxiety as a "0."    Review of Systems: Psychiatric: Agitation: No Hallucination: No Depressed Mood: "Pink Cloud" Insomnia: No Hypersomnia: No Altered Concentration: No Feels Worthless: No Grandiose Ideas: No Belief In Special Powers: No New/Increased Substance Abuse: No Compulsions: No  Neurologic: Headache: No Seizure: No Paresthesias: No  Current Medications: Your Medication List atorvastatin 20 MG tablet Commonly known as: LIPITOR 1 tablet Orally Once a day  gabapentin 300 MG capsule Commonly known as: NEURONTIN Take 1 capsule (300 mg total) by mouth 4 (four) times daily.  lamoTRIgine 100 MG tablet Commonly known as: LaMICtal Take 1 tablet (100 mg total) by mouth daily.  metoprolol succinate 50 MG 24 hr tablet Commonly known as: TOPROL-XL Take 50 mg by mouth daily.  traZODone 50 MG tablet Commonly known as: DESYREL 2 tablets at bedtime Orally Once a day for 90 days    Mental Status Examination  Appearance: Alert: Yes Attention: good  Cooperative: Yes Eye Contact: Good Speech: Clear and coherent Psychomotor Activity: Normal Memory Concentration/Attention: Normal/intact Oriented: person, place, time/date and situation Mood: Euthymic Affect: Appropriate and  Congruent Thought Processes and Associations: Coherent and Intact Fund of Knowledge: Good Thought Content: WDL Insight: Good Judgement: Good  UDS:01/04/2023 Clear  PDMP:Gabapentin rx  Diagnosis:  Alcohol use disorder, severe, dependence (HCC) Tobacco use disorder Unspecified mood (affective) disorder (HCC) Peripheral polyneuropathy Essential hypertension Alcohol-induced insomnia (HCC) Unresolved grief Idiopathic peripheral neuropathy Hypercholesteremia Type 2 diabetes mellitus with diabetic polyneuropathy, without long-term current use of insulin (HCC) Fatty liver, alcoholic Macrocytosis Essential (primary) hyper  Assessment: In the "honeymoon phase" of recovery  Treatment Plan: Per admission Refill Neurontin Suggest Multi and B vitamin supplements    Maryjean Morn, PA-CPatient ID: Logan Martinez, male   DOB: 11/17/1965, 57 y.o.   MRN: [id number]

## 2023-01-11 NOTE — Progress Notes (Signed)
Daily Group Progress Note   Program: CD IOP     Group Time: 9 a.m. to 12 p.m.   Type of Therapy: Process and Psychoeducational    Topic: The therapist checks in with group members, assesses for SI/HI/psychosis and overall level of functioning. The therapist inquires about sobriety date and number of community support meetings attended since last session.   The therapist explains why it is important that a person understand that his or her addiction is a disease and not a moral weakness as people will literally drink or drug themselves to death to "prove" they do not have a moral weakness but will not do the same when faced with a disease. The therapist reiterates some of the genetic and biological reasons that addiction is classified as a disease. He reiterates that one's personal life should revolve around recovery and that recovery should not revolve around a person's personal life. He discusses the importance of developing an action plan to avoid triggers versus falling into a shame spiral which allows nothing productive to happen and prevents any learning from the relapse. He facilitates a discussion on emotional triggers for relapse such as boredom which is a common complaint in those in early recovery due to a lack of Dopamine receptors.    Summary: Logan Martinez presents rating his depression as a "0" and his anxiety as a "0."    He describes his mood as "happy" and "free." He says that he has the same sobriety date and that he attended some sort of virtual AA literature study group that he enjoyed a great deal. He does not like speaker meetings but says that there only seem to be mostly speaker meetings around the time that he wants to attend. Thus, the therapist suggests that Logan Martinez can also look for virtual meetings outside of [facility] and in other time zones.   The therapist inquires as to when Logan Martinez will attend an in person meeting as he suggested that he might do with one of the other group  members; however, he says that he is not ready. When the therapist asks the reason that he is not ready, he is unable to give a reason for this. Thus, the therapist gives him the homework of writing on the question of why he is not ready to attend an in person meeting.    Progress Towards Goals: Logan Martinez reports no alcohol use.    UDS collected: Yes Results: Yes, negative for drugs and alcohol   AA/NA attended?: Yes   Sponsor?: No   Myrna Blazer, MA, LCSW, [facility], LCAS 01/11/2023

## 2023-01-13 ENCOUNTER — Ambulatory Visit (INDEPENDENT_AMBULATORY_CARE_PROVIDER_SITE_OTHER): Payer: BC Managed Care – PPO | Admitting: Licensed Clinical Social Worker

## 2023-01-13 DIAGNOSIS — F1020 Alcohol dependence, uncomplicated: Secondary | ICD-10-CM

## 2023-01-13 DIAGNOSIS — F17200 Nicotine dependence, unspecified, uncomplicated: Secondary | ICD-10-CM

## 2023-01-13 NOTE — Progress Notes (Addendum)
Daily Group Progress Note   Program: CD IOP     Group Time: 9 a.m. to 12 p.m.   Type of Therapy: Process and Psychoeducational    Topic: The therapist checks in with group members, assesses for SI/HI/psychosis and overall level of functioning. The therapist inquires about sobriety date and number of community support meetings attended since last session.   The therapist answers patients questions about psychotropic medications regarding the reasons that Seroquel and Gabapentin are used. He talks about the benefits of exercise in relation to reducing the intensity and length of PAWS. He suggests techniques for overcoming resistant to exercise and recommends setting small, attainable goals versus taking on too many tasks at one. He explains that people have a finite amount of willpower or energy and make too many changes at one time can deplete this reserve. He discusses how to use assertiveness as a means of dealing with triggers and answers the question concerning, "what is a dry drunk?" He emphasizes the importance of developing sober supports while talking about the difficulty in doing so noting that people can recovery can ask those with years of recovery how they overcame these obstacles without having to reinvent the wheel.  The therapist shows a video by Orlin Hilding illustrating that there is no such thing as a soft drug versus a hard drug and no really differences between an alcoholic and an IV drug users. The therapist facilitates a group discussion eliciting feedback from members concerning their thoughts and reaction to this information.    Summary: Logan Martinez presents rating his depression as a "0" and his anxiety as a "0."    He describes his mood as "energetic" and "confident." He attended a virtual meeting which he does with his camera off. Logan Martinez asks, "what is a dry drunk" having heard the term explained during this meeting. Another group member and the therapist provide an answer to this  question.  When the therapist asks Logan Martinez about his homework, Logan Martinez says that he did not do it as he "forgot" right after leaving group. When the therapist suggests that Logan Martinez seems to have blocked it out, he denies this; however, the therapist observes that [name]'s immediately forgetting an assignment given to him by his therapist in an Intensive Treatment Program seems curious. Thus, he informs Logan Martinez that he would like for him to complete this by Monday. At the conclusion of the session, the therapist notes that [name]'s behavior in relation to the assignment would possibly be congruent with the behavior of a "dry drunk."     Progress Towards Goals: Logan Martinez reports no alcohol use.    UDS collected: No Results: No   AA/NA attended?: Yes   Sponsor?: No   Myrna Blazer, MA, LCSW, [facility], LCAS 01/13/2023

## 2023-01-16 ENCOUNTER — Encounter (HOSPITAL_COMMUNITY): Payer: Self-pay

## 2023-01-16 ENCOUNTER — Ambulatory Visit (HOSPITAL_COMMUNITY): Payer: BC Managed Care – PPO

## 2023-01-16 ENCOUNTER — Telehealth (HOSPITAL_COMMUNITY): Payer: Self-pay | Admitting: Licensed Clinical Social Worker

## 2023-01-16 NOTE — Telephone Encounter (Signed)
The therapist attempts to reach Logan Martinez after he no shows for group today leaving a HIPAA-compliant voicemail.  [address] MA, LCSW, [facility], LCAS 01/16/2023

## 2023-01-18 ENCOUNTER — Ambulatory Visit (HOSPITAL_COMMUNITY): Payer: BC Managed Care – PPO | Admitting: Licensed Clinical Social Worker

## 2023-01-18 DIAGNOSIS — F1020 Alcohol dependence, uncomplicated: Secondary | ICD-10-CM

## 2023-01-18 DIAGNOSIS — F17200 Nicotine dependence, unspecified, uncomplicated: Secondary | ICD-10-CM

## 2023-01-19 DIAGNOSIS — F1020 Alcohol dependence, uncomplicated: Secondary | ICD-10-CM | POA: Diagnosis not present

## 2023-01-19 NOTE — Progress Notes (Addendum)
Daily Group Progress Note   Program: CD IOP     Group Time: 9 a.m. to 12 p.m.   Type of Therapy: Process and Psychoeducational    Topic: The therapist checks in with group members, assesses for SI/HI/psychosis and overall level of functioning. The therapist inquires about sobriety date and number of community support meetings attended since last session.   The therapist presents information supporting the reason that "[place] sober" greatly increases a person's likelihood of drinking and/or relapsing and how other addictive substances have the same potential. He also educates members about cannabis hyperemesis. He explains how alcohol and other chemical disinhibit a person's prefrontal cortex and how this plays a part in relapse. Additionally, he presents information on managing dual diagnoses i.e. addiction and co-occurring mental health disorders and how best to advocate for one's self in relation to interacting with health care professionals in order to get the best care.       Summary: Logan Martinez presents rating his depression as a "0" and his anxiety as a "0."    He describes his mood as "peaceful" and "thankful" He says that he did do his homework from the previous two groups; however, the group runs out of time before he is able to share it.  Logan Martinez asks a few questions during today's discussion; however, is likely the least engaged group member. At the conclusion of group, he indicates that he did not get any information today that he felt specifically pertained to him; however, he says that the information gave him a better understanding of psychotropic medications and what others go through.    Progress Towards Goals: Logan Martinez reports no alcohol use.    UDS collected: No Results: two consecutive UDS negative for drugs and alcohol   AA/NA attended?: Yes   Sponsor?: No   Myrna Blazer, MA, LCSW, [facility], LCAS 01/18/2023

## 2023-01-20 ENCOUNTER — Ambulatory Visit (INDEPENDENT_AMBULATORY_CARE_PROVIDER_SITE_OTHER): Payer: BC Managed Care – PPO | Admitting: Licensed Clinical Social Worker

## 2023-01-20 DIAGNOSIS — F17200 Nicotine dependence, unspecified, uncomplicated: Secondary | ICD-10-CM

## 2023-01-20 DIAGNOSIS — F4321 Adjustment disorder with depressed mood: Secondary | ICD-10-CM

## 2023-01-20 DIAGNOSIS — G609 Hereditary and idiopathic neuropathy, unspecified: Secondary | ICD-10-CM

## 2023-01-20 DIAGNOSIS — K700 Alcoholic fatty liver: Secondary | ICD-10-CM

## 2023-01-20 DIAGNOSIS — F39 Unspecified mood [affective] disorder: Secondary | ICD-10-CM

## 2023-01-20 DIAGNOSIS — F1020 Alcohol dependence, uncomplicated: Secondary | ICD-10-CM

## 2023-01-20 DIAGNOSIS — D7589 Other specified diseases of blood and blood-forming organs: Secondary | ICD-10-CM

## 2023-01-20 DIAGNOSIS — F10982 Alcohol use, unspecified with alcohol-induced sleep disorder: Secondary | ICD-10-CM

## 2023-01-20 DIAGNOSIS — E1142 Type 2 diabetes mellitus with diabetic polyneuropathy: Secondary | ICD-10-CM

## 2023-01-20 DIAGNOSIS — G629 Polyneuropathy, unspecified: Secondary | ICD-10-CM

## 2023-01-20 DIAGNOSIS — E7800 Pure hypercholesterolemia, unspecified: Secondary | ICD-10-CM

## 2023-01-20 DIAGNOSIS — I10 Essential (primary) hypertension: Secondary | ICD-10-CM

## 2023-01-20 NOTE — Progress Notes (Signed)
Daily Group Progress Note   Program: [facility]     Group Time: 9 a.m. to 12 p.m.   Type of Therapy: Process and Psychoeducational    Topic: The therapist checks in with group members, assesses for SI/HI/psychosis and overall level of functioning. The therapist inquires about sobriety date and number of community support meetings attended since last session.    The therapist introduces two new group members and answers questions regarding MAT for opioid use disorders in addition to MAT in general. The therapy facilitates a discussion today primarily on the subject of why "half-measures" in recovery are not successful and the reason that in person meeting attendance and Step work are strongly encouraged. The therapist also continues to discuss the importance of assertiveness in relation to recovery and one's healthcare in general modeling how this would work.       Summary: Weston Brass presents rating his depression as a "0" and his anxiety as a "0."    He describes his mood as "happy" and "peaceful." He says that he determined that the two reasons he has not attended in person meetings is because he is getting what he needs from attending this group and that if he were in an in person meeting in which people stopped talking that he would feel pressured to share. He now says he attends three virtual meetings per week with his camera off. He was in a meeting of only four or five people with long silence saying that he did not feel compelled to talk as his camera was off.  When asked the reason he attends IOP and what his goals are, he notes that he wants to learn ways to remain sober and to listen to others' stories saying that the relapses only make him more determined to not drink.   The therapist makes the observation that Weston Brass says that he wants to learn ways to stay sober; however, when this therapist and the program of Alcoholics Anonymous point out these ways, Weston Brass does not want to listen and follow  through with what he is told works. The therapist also observes that Weston Brass "stays busy" mainly with solitary activities and visits his friends at time; however, these are the same friends with whom he could not go on the annual fishing trip as they drink continuously during this trip.   Given that Weston Brass reports his mood is good and is at the max in terms of what else he will do, the therapist suggests that no further benefit can be gained from attending and that he is ready for discharge. The therapist reminds Weston Brass that he can get all the stories he wants to hear concerning recovery via meetings; online or otherwise. He informs Weston Brass that there are benefits to be had from doing step work as people will identify issues and character defects they do not know they have yet Weston Brass apparently sees no need to get a Marketing executive.  The therapist talks to Weston Brass about his aftercare with Weston Brass saying that he plans on talking to his PCP about continuing his Lamictal declining an offer to continue with the PA-C. He also declines the need for a therapy appointment but says that he has this therapist's number adding, "I will call you in a year" apparently meaning that he will call this therapist at a year to report his continued sobriety having previously told this therapist that he intended on "proving" him "wrong."    Progress Towards Goals: Weston Brass reports no alcohol use.  UDS collected: No Results: No   AA/NA attended?: Yes   Sponsor?: No   Myrna Blazer, MA, LCSW, [facility], LCAS 01/20/2023

## 2023-01-23 ENCOUNTER — Encounter (HOSPITAL_COMMUNITY): Payer: Self-pay

## 2023-01-23 ENCOUNTER — Ambulatory Visit (HOSPITAL_COMMUNITY): Payer: BC Managed Care – PPO | Admitting: Licensed Clinical Social Worker

## 2023-01-25 ENCOUNTER — Ambulatory Visit (HOSPITAL_COMMUNITY): Payer: BC Managed Care – PPO

## 2023-01-27 ENCOUNTER — Ambulatory Visit (HOSPITAL_COMMUNITY): Payer: BC Managed Care – PPO

## 2023-01-30 ENCOUNTER — Ambulatory Visit (HOSPITAL_COMMUNITY): Payer: BC Managed Care – PPO

## 2023-02-01 ENCOUNTER — Encounter (HOSPITAL_COMMUNITY): Payer: Self-pay | Admitting: Licensed Clinical Social Worker

## 2023-02-01 ENCOUNTER — Ambulatory Visit (HOSPITAL_COMMUNITY): Payer: BC Managed Care – PPO

## 2023-02-01 NOTE — Progress Notes (Addendum)
[facility]                                                                                Discharge Summary   Date of Admission: 12/02/2022 Referall Source: [facility]/OBS                                                                        Date of Discharge: 01/22/2023 Sobriety Date: 11/19/2022 Admission Diagnosis: . Alcohol use disorder, severe, dependence (HCC)  F10.20      Seems to have significant resistance to outside support group/denial     2. Tobacco use disorder  F17.200       3. Unresolved grief  F43.21       4. Idiopathic peripheral neuropathy  G60.9      ? alcohol related vs Diabetes     5. Hypercholesteremia  E78.00       6. Type 2 diabetes mellitus with diabetic polyneuropathy, without long-term current use of insulin (HCC)  E11.42      Untreated     7. Alcoholic hepatitis without ascites  K70.10       8. Essential (primary) hypertension  I10       9. Macrocytosis  D75.89      A,lcohol related   Course of Treatment:  Logan Martinez presented to treatment with a sense of euphoria (sometimes referred to as a [employer]") which he maintained thruout the course of his treatment. He did feel that Lamictal was a major factor in his mood.He was not especially interested in AA but did eventually begin to try some.He initially seemed oblivious to the relationship of his eripheral Neuropathy to alcohol but when informed said "Oh yeah-my doctor said that "-(as if he didn't believe it at the time) The same seemed to be true of his understanding of his alcoholic liver disease which he also came to understand.He was also educated about the effects of alcohol on his diabetes. Although he claimed to be free of any withdrawal he did have sisgnificant anxiety for which he received Gabapentin while in [facility]. He was given an HS dose increase to QID and reported improvement.His Lamictal was renewed at 100mg  every day 90 day  supply. Counselor observation: 01/20/2023 Logan Martinez reports his mood is good and is at the max in terms of what else he will do, the therapist suggests that no further benefit can be gained from attending and that he is ready for discharge. The therapist talks to Logan Martinez about his aftercare with Logan Martinez saying that he plans on talking to his PCP about continuing his Lamictal declining an offer to continue with the PA-C. He also declines the need for a therapy appointment but says that he has this therapist's number adding, "I will call you in a year" apparently meaning that he will call this therapist at a year to report his continued sobriety having previously told this therapist that he intended  on "proving" him "wrong."    Medications: atorvastatin 20 MG tablet Commonly known as: LIPITOR 1 tablet Orally Once a day  gabapentin 300 MG capsule Commonly known as: NEURONTIN Take 1 capsule (300 mg total) by mouth 4 (four) times daily.  lamoTRIgine 100 MG tablet Commonly known as: LaMICtal Take 1 tablet (100 mg total) by mouth daily.  metoprolol succinate 50 MG 24 hr tablet Commonly known as: TOPROL-XL Take 50 mg by mouth daily.  traZODone 50 MG tablet Commonly known as: DESYREL 2 tablets at bedtime Orally Once a day for 90 days   Discharge Diagnosis:                                                                                Alcohol use disorder, severe, dependence (HCC) Tobacco use disorder Unspecified mood (affective) disorder (HCC) Peripheral polyneuropathy Essential hypertension Alcohol-induced insomnia (HCC) Unresolved grief Idiopathic peripheral neuropathy Hypercholesteremia Type 2 diabetes mellitus with diabetic polyneuropathy, without long-term current use of insulin (HCC) Fatty liver, alcoholic Essential (primary) hypertension Macrocytosis Alcoholic Fatty Liver  Plan of Action to Address Continuing Problems:  Goals and Activities to Help Maintain Sobriety: Stay away from people  ,places and things that are triggers Continue practicing Fair Fighting rules in interpersonal conflicts. Continue alcohol and drug refusal skills and call on support system  Attend AA/NA meetings AT LEAST as often as you use  Obtain a sponsor and a home group in AA/NA. Return to [facility] as scheduled  Referrals:  Aftercare:he plans on talking to his PCP about continuing his Lamictal declining an offer to continue with the PA-C. Medication management:as above Other:PDMP Gabapentin rx  Next appointment: Pt to schedule with PCP  Prognosis:Fair to good depending on his ability to continue treatment recommendations     Client has NOTparticipated in the development of this discharge plan and may receive a copy of this completed plan   Patient ID: Logan Martinez, male   DOB: January 10, 1966, 57 y.o.   MRN: [id number]

## 2023-02-01 NOTE — Addendum Note (Signed)
Addended by: Court Joy on: 02/01/2023 03:37 PM   Modules accepted: Level of Service

## 2023-02-03 ENCOUNTER — Ambulatory Visit (HOSPITAL_COMMUNITY): Payer: BC Managed Care – PPO

## 2023-02-06 ENCOUNTER — Ambulatory Visit (HOSPITAL_COMMUNITY): Payer: BC Managed Care – PPO

## 2023-02-08 ENCOUNTER — Ambulatory Visit (HOSPITAL_COMMUNITY): Payer: BC Managed Care – PPO

## 2023-02-10 ENCOUNTER — Ambulatory Visit (HOSPITAL_COMMUNITY): Payer: BC Managed Care – PPO

## 2023-02-13 ENCOUNTER — Ambulatory Visit (HOSPITAL_COMMUNITY): Payer: BC Managed Care – PPO

## 2023-02-15 ENCOUNTER — Ambulatory Visit (HOSPITAL_COMMUNITY): Payer: BC Managed Care – PPO

## 2023-02-17 ENCOUNTER — Ambulatory Visit (HOSPITAL_COMMUNITY): Payer: BC Managed Care – PPO

## 2023-02-20 ENCOUNTER — Encounter: Payer: Self-pay | Admitting: Medical

## 2023-02-20 ENCOUNTER — Ambulatory Visit (HOSPITAL_COMMUNITY): Payer: BC Managed Care – PPO

## 2023-02-22 DIAGNOSIS — G629 Polyneuropathy, unspecified: Secondary | ICD-10-CM | POA: Diagnosis not present

## 2023-02-22 DIAGNOSIS — E7800 Pure hypercholesterolemia, unspecified: Secondary | ICD-10-CM | POA: Diagnosis not present

## 2023-02-22 DIAGNOSIS — Z23 Encounter for immunization: Secondary | ICD-10-CM | POA: Diagnosis not present

## 2023-02-22 DIAGNOSIS — I10 Essential (primary) hypertension: Secondary | ICD-10-CM | POA: Diagnosis not present

## 2023-03-01 ENCOUNTER — Other Ambulatory Visit (HOSPITAL_COMMUNITY): Payer: Self-pay | Admitting: Medical

## 2023-03-02 NOTE — Telephone Encounter (Signed)
Pt diswcharged from [facility] to FU with PCP for meds

## 2023-03-08 IMAGING — US US ABDOMEN LIMITED RUQ/ASCITES
1 series · 14 of 25 positions shown · non-contrast
Comparison: November 18, 2019

CLINICAL DATA: Elevated liver function tests.

EXAM:
ULTRASOUND ABDOMEN LIMITED RIGHT UPPER QUADRANT

[Series 1: us abdomen limited ruq/ascites · 0.25mm/px · 14 of 45 slices shown]
[im 1/45]
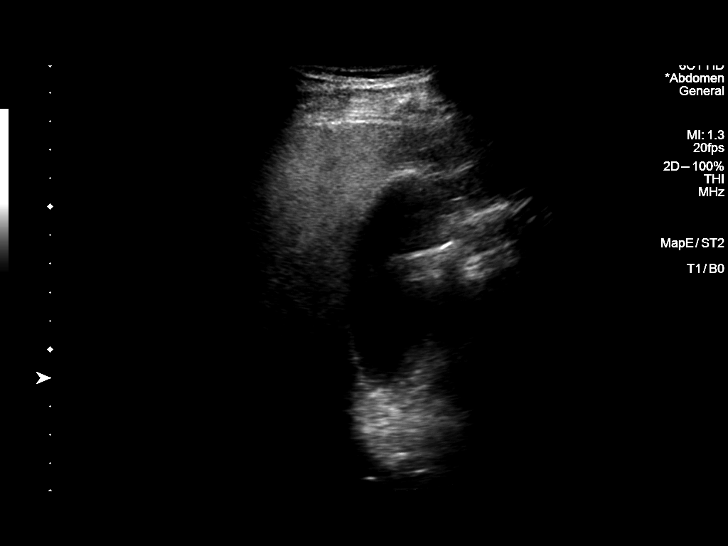
[im 4/45]
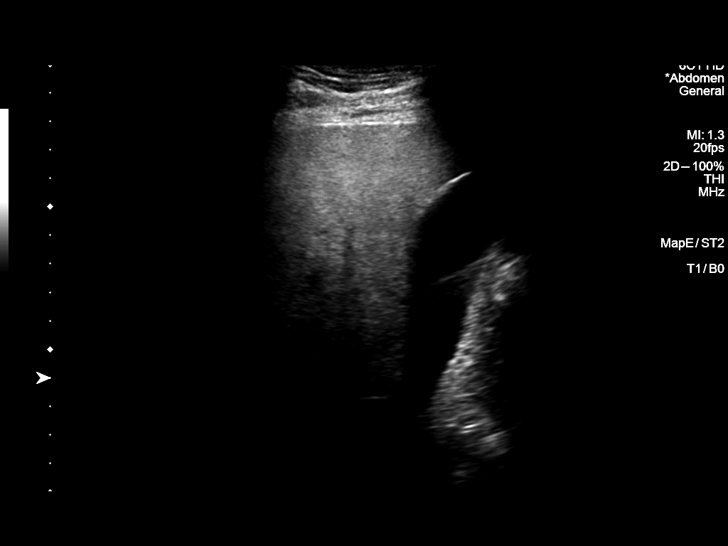
[im 8/45]
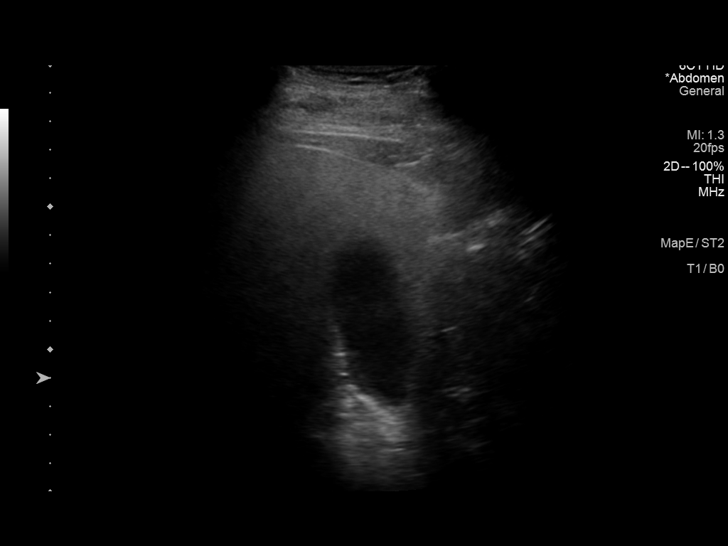
[im 12/45]
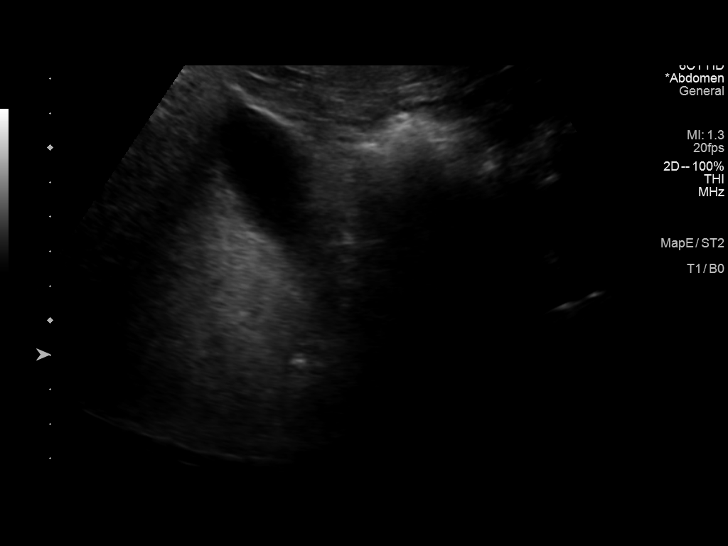
[im 15/45]
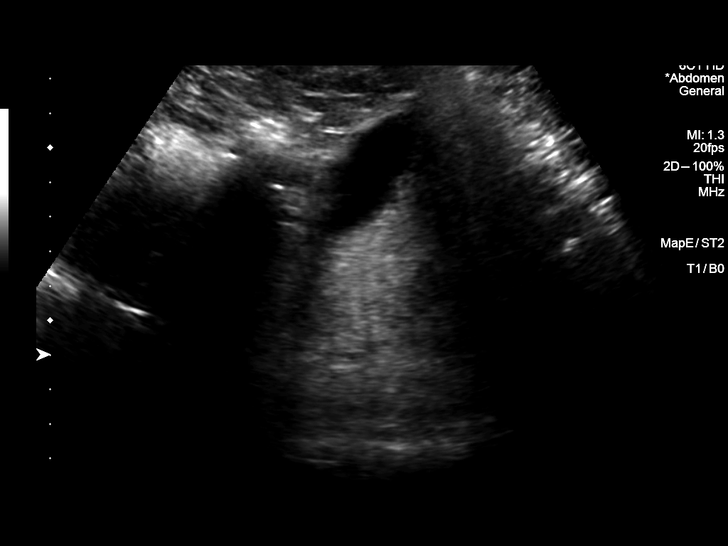
[im 17/45]
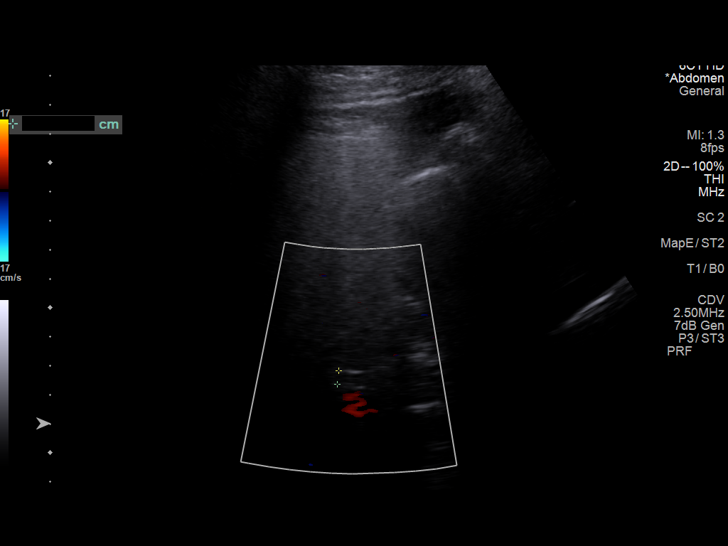
[im 21/45]
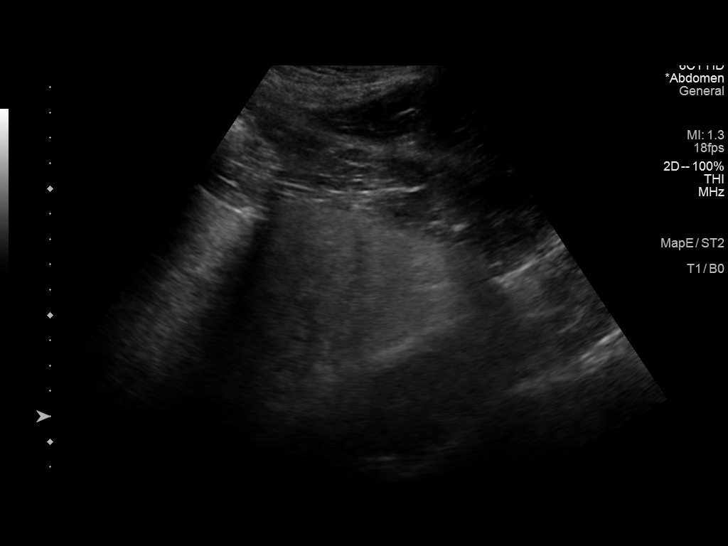
[im 24/45]
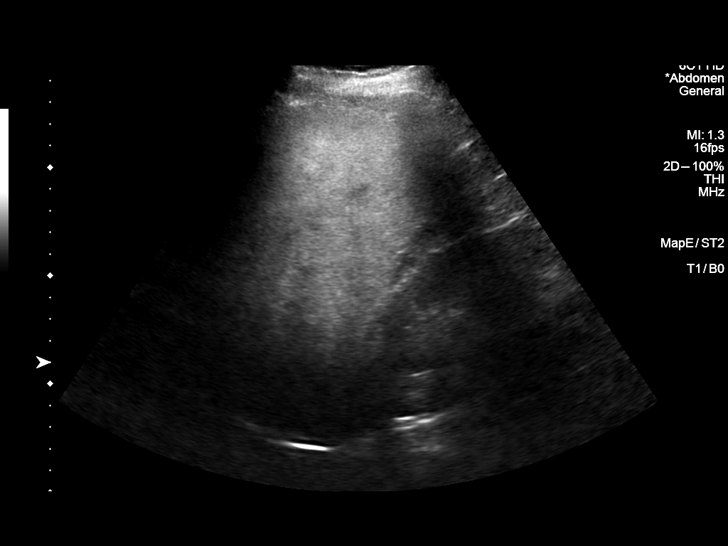
[im 28/45]
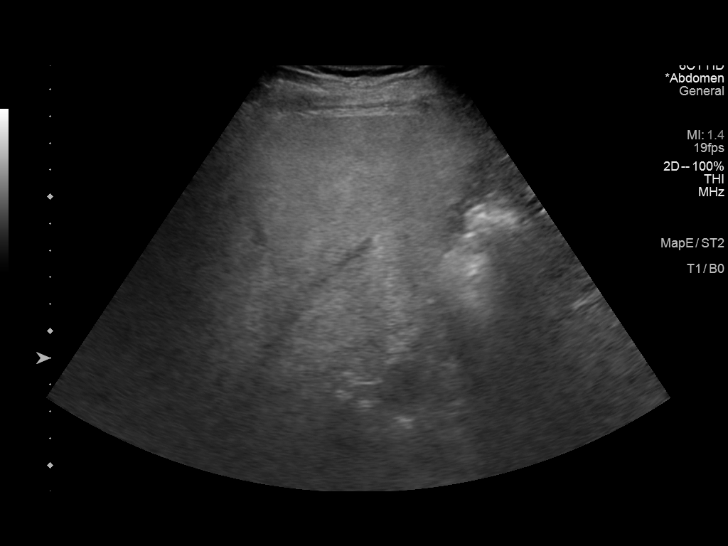
[im 30/45]
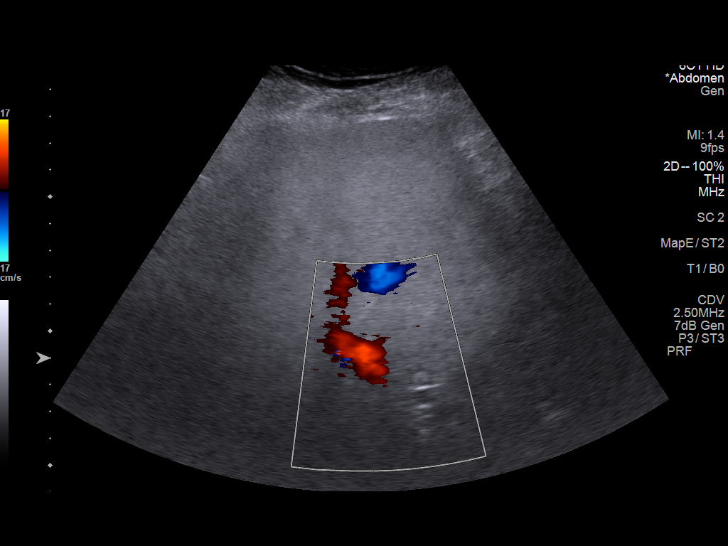
[im 34/45]
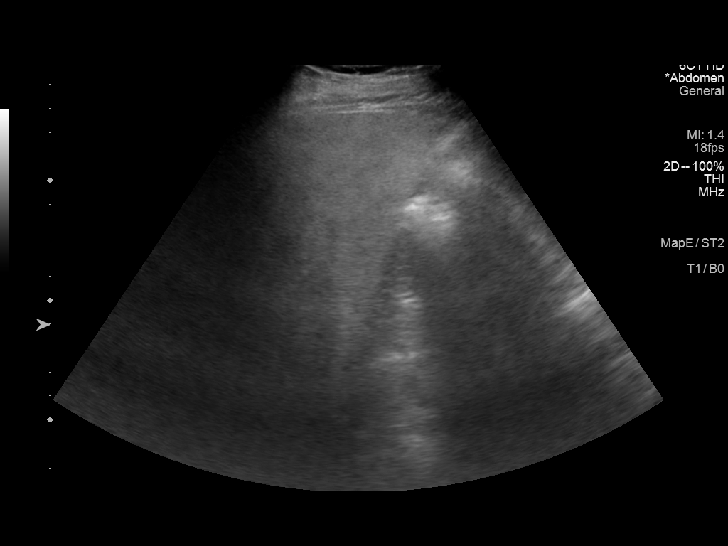
[im 37/45]
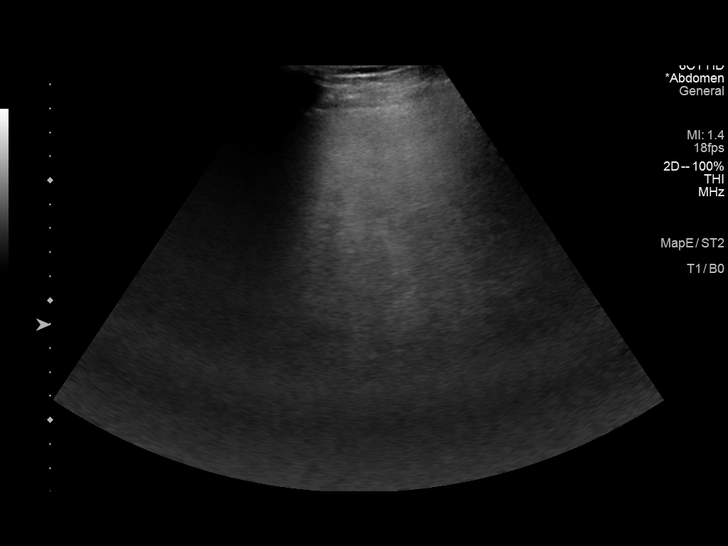
[im 41/45]
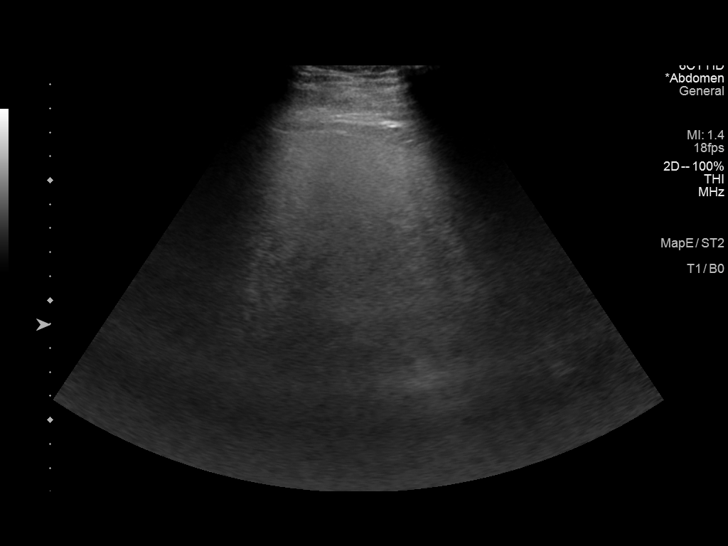
[im 45/45]
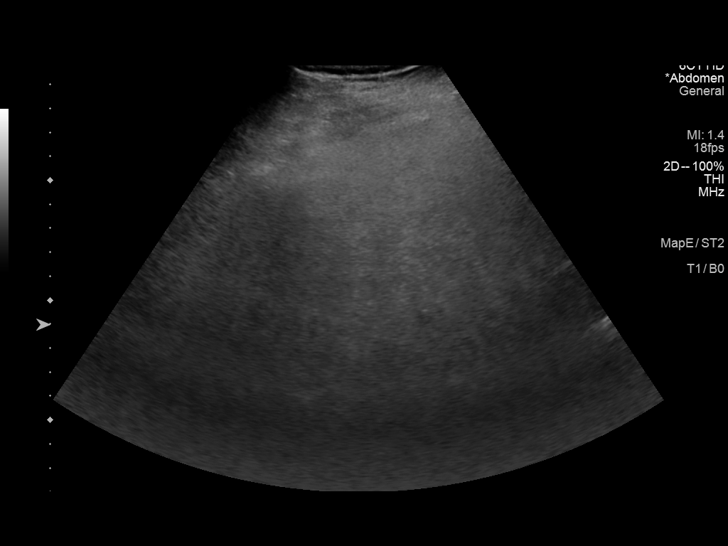

[14 of 25 positions shown; findings below may reference images not displayed]

FINDINGS: Gallbladder:

No gallstones or wall thickening visualized (2.1 mm). No sonographic
Murphy sign noted by sonographer.

Common bile duct:

Diameter: 4.7 mm

Liver:

No focal lesion identified. Diffusely increased echogenicity of the
liver parenchyma is noted. Portal vein is patent on color Doppler
imaging with normal direction of blood flow towards the liver.

Other: None.
IMPRESSION: Fatty liver.

## 2023-03-31 ENCOUNTER — Other Ambulatory Visit (HOSPITAL_COMMUNITY): Payer: Self-pay | Admitting: Medical

## 2023-04-03 NOTE — Telephone Encounter (Signed)
pT NO LONGER IN TREATMENT

## 2023-05-17 DIAGNOSIS — F1021 Alcohol dependence, in remission: Secondary | ICD-10-CM | POA: Diagnosis not present

## 2023-05-17 DIAGNOSIS — R748 Abnormal levels of other serum enzymes: Secondary | ICD-10-CM | POA: Diagnosis not present

## 2023-05-17 DIAGNOSIS — E1140 Type 2 diabetes mellitus with diabetic neuropathy, unspecified: Secondary | ICD-10-CM | POA: Diagnosis not present

## 2023-05-17 DIAGNOSIS — Z23 Encounter for immunization: Secondary | ICD-10-CM | POA: Diagnosis not present

## 2023-05-28 ENCOUNTER — Other Ambulatory Visit (HOSPITAL_COMMUNITY): Payer: Self-pay | Admitting: Medical

## 2023-06-15 NOTE — Telephone Encounter (Signed)
Pt to FU with PCP or make appt with me
